# Patient Record
Sex: Female | Born: 1980
Health system: Southern US, Community
[De-identification: ages and names within clinical notes are randomized; demographics above are authoritative.]

## PROBLEM LIST (undated history)

## (undated) DIAGNOSIS — G43909 Migraine, unspecified, not intractable, without status migrainosus: Secondary | ICD-10-CM

## (undated) DIAGNOSIS — Z8619 Personal history of other infectious and parasitic diseases: Secondary | ICD-10-CM

## (undated) HISTORY — DX: Migraine, unspecified, not intractable, without status migrainosus: G43.909

## (undated) HISTORY — DX: Personal history of other infectious and parasitic diseases: Z86.19

---

## 2000-01-22 HISTORY — PX: CHOLECYSTECTOMY: SHX55

## 2000-01-22 HISTORY — PX: TUBAL LIGATION: SHX77

## 2014-07-22 DIAGNOSIS — G43909 Migraine, unspecified, not intractable, without status migrainosus: Secondary | ICD-10-CM

## 2014-07-22 HISTORY — DX: Migraine, unspecified, not intractable, without status migrainosus: G43.909

## 2014-08-03 ENCOUNTER — Encounter: Payer: Self-pay | Admitting: Family

## 2014-08-03 ENCOUNTER — Ambulatory Visit (INDEPENDENT_AMBULATORY_CARE_PROVIDER_SITE_OTHER): Payer: 59 | Admitting: Family

## 2014-08-03 VITALS — BP 122/86 | HR 79 | Temp 98.3°F | Resp 16 | Ht 65.0 in | Wt 213.0 lb

## 2014-08-03 DIAGNOSIS — G43809 Other migraine, not intractable, without status migrainosus: Secondary | ICD-10-CM

## 2014-08-03 DIAGNOSIS — G43909 Migraine, unspecified, not intractable, without status migrainosus: Secondary | ICD-10-CM | POA: Insufficient documentation

## 2014-08-03 MED ORDER — ONDANSETRON 4 MG PO TBDP: 4.0000 mg | ORAL_TABLET | Freq: Three times a day (TID) | ORAL | Status: DC | PRN

## 2014-08-03 MED ORDER — SUMATRIPTAN SUCCINATE 50 MG PO TABS: 50.0000 mg | ORAL_TABLET | ORAL | Status: DC | PRN

## 2014-08-03 NOTE — Patient Instructions (Signed)
Start Imitrex as needed for migraines, you can use zofran as needed for nausea. Schedule a complete physical at the front desk. Welcome to [facility]!

## 2014-08-03 NOTE — Assessment & Plan Note (Signed)
Uncontrolled. Will add trial of imitrex prn, zofran odt prn nausea. Discussed addition of prophylactic med, but at this point pt would like to try a prn med which I think is reasonable. If migraines become more severe/frequent can reconsider.

## 2014-08-03 NOTE — Progress Notes (Signed)
Pre visit review using our clinic review tool, if applicable. No additional management support is needed unless otherwise documented below in the visit note. 

## 2014-08-03 NOTE — Progress Notes (Signed)
Subjective:    Patient ID: Kathy [name], female    DOB: Jun 04, 1980, 34 y.o.   MRN: [id number]  HPI   Ms. Kathy [name] is a 34 yr old female who presents today to establish care.  She presents today with chief complaint about headache. She reports severe headaches x 5 years but in the last 7-8 months she noted that her headaches have become more severe.  Had HA last Wednesday which was associated with nausea/vomitting, photo/phonophobia. She uses ibuprofen with some improvement usually, but it did not help the most recent HA lasted 2-3 days. Denies family history of migraines.  In the last month she has had 3-4 headaches, but rarely as severe as HA last week.     Review of Systems  Constitutional: Negative for unexpected weight change.  HENT: Positive for rhinorrhea and sinus pressure. Negative for hearing loss.   Eyes: Negative for visual disturbance.  Respiratory: Negative for cough and shortness of breath.   Cardiovascular: Negative for chest pain and leg swelling.  Gastrointestinal: Negative for nausea, diarrhea and constipation.  Genitourinary: Negative for dysuria, frequency and menstrual problem.  Musculoskeletal: Negative for myalgias and arthralgias.  Skin: Negative for rash.  Neurological: Positive for headaches.  Hematological: Negative for adenopathy.  Psychiatric/Behavioral: Negative for dysphoric mood and agitation.   Past Medical History  Diagnosis Date  . History of chicken pox     History   Social History  . Marital Status: Married    Spouse Name: N/A  . Number of Children: N/A  . Years of Education: N/A   Occupational History  . Not on file.   Social History Main Topics  . Smoking status: Never Smoker   . Smokeless tobacco: Never Used  . Alcohol Use: No  . Drug Use: No  . Sexual Activity: Not on file   Other Topics Concern  . 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 Cholecystectomy  2002  . Tubal ligation  2002    Family History  Problem Relation Age of Onset  . Hypertension Father   . Arthritis Maternal Grandmother   . Diabetes Maternal Grandmother   . Congestive Heart Failure Maternal Grandmother     No Known Allergies  No current outpatient prescriptions on file prior to visit.   No current facility-administered medications on file prior to visit.    BP 122/86 mmHg  Pulse 79  Temp(Src) 98.3 F (36.8 C) (Oral)  Resp 16  Ht 5\' 5"  (1.651 m)  Wt 213 lb (96.616 kg)  BMI 35.44 kg/m2  SpO2 99%  LMP 07/17/2014       Objective:   Physical Exam  Constitutional: She appears well-developed and well-nourished.  HENT:  Head: Normocephalic and atraumatic.  Eyes: EOM are normal. Pupils are equal, round, and reactive to light.  Cardiovascular: Normal rate, regular rhythm and normal heart sounds.   No murmur heard. Pulmonary/Chest: Effort normal and breath sounds normal. No respiratory distress. She has no wheezes.  Lymphadenopathy:    She has no cervical adenopathy.  Neurological: She is alert. She exhibits normal muscle tone. Coordination normal.  Psychiatric: She has a normal mood and affect. Her behavior is normal.  Judgment and thought content normal.          Assessment & Plan:

## 2014-10-22 LAB — HM PAP SMEAR: HM Pap smear: NORMAL

## 2014-11-02 ENCOUNTER — Encounter: Payer: 59 | Admitting: Family

## 2015-02-07 ENCOUNTER — Encounter: Payer: Self-pay | Admitting: Family

## 2015-02-07 ENCOUNTER — Telehealth: Payer: Self-pay | Admitting: Family

## 2015-02-07 ENCOUNTER — Encounter: Payer: 59 | Admitting: Family

## 2015-02-10 ENCOUNTER — Encounter: Payer: Self-pay | Admitting: Family

## 2015-02-10 NOTE — Telephone Encounter (Signed)
Pt was no show 02/07/15 9:00am for cpe, pt rescheduled thru my chart 02/07/15 9:09am but no call to the office as to why she didn't come for appt, pt has cancelled cpe previously, charge or no charge?

## 2015-02-10 NOTE — Telephone Encounter (Signed)
Marked to charge & mailing letter °

## 2015-02-10 NOTE — Telephone Encounter (Signed)
Yes please

## 2015-02-16 ENCOUNTER — Telehealth: Payer: Self-pay | Admitting: Family

## 2015-02-16 NOTE — Telephone Encounter (Signed)
Pt called in stating that she received a no show letter.. Pt says that she called in an lvm before appt cancelling it. Pt would like to not be charged the no show fee.  Please assist   CB [phone]

## 2015-02-17 NOTE — Telephone Encounter (Signed)
Ok

## 2015-02-17 NOTE — Telephone Encounter (Signed)
Already sent to [place]

## 2015-02-22 DIAGNOSIS — N926 Irregular menstruation, unspecified: Secondary | ICD-10-CM | POA: Diagnosis not present

## 2015-03-16 ENCOUNTER — Encounter: Payer: Self-pay | Admitting: *Deleted

## 2015-03-16 ENCOUNTER — Telehealth: Payer: Self-pay | Admitting: *Deleted

## 2015-03-16 MED FILL — SUMATRIPTAN SUCC 50 MG TAB: 50 | 30 days supply | Qty: 9 | Fill #1

## 2015-03-16 NOTE — Telephone Encounter (Signed)
Pre-Visit Call completed with patient and chart updated.   Pre-Visit Info documented in Specialty Comments under [employer].    

## 2015-03-17 ENCOUNTER — Ambulatory Visit (INDEPENDENT_AMBULATORY_CARE_PROVIDER_SITE_OTHER): Payer: 59 | Admitting: Family

## 2015-03-17 VITALS — BP 124/68 | HR 71 | Temp 98.5°F | Ht 65.0 in | Wt 211.6 lb

## 2015-03-17 DIAGNOSIS — Z23 Encounter for immunization: Secondary | ICD-10-CM

## 2015-03-17 DIAGNOSIS — Z0000 Encounter for general adult medical examination without abnormal findings: Secondary | ICD-10-CM

## 2015-03-17 LAB — BASIC METABOLIC PANEL
BUN: 12 mg/dL (ref 6–23)
CHLORIDE: 107 meq/L (ref 96–112)
CO2: 28 mEq/L (ref 19–32)
Calcium: 9.4 mg/dL (ref 8.4–10.5)
Creatinine, Ser: 0.92 mg/dL (ref 0.40–1.20)
GFR: 73.68 mL/min (ref >60.00)
Glucose, Bld: 85 mg/dL (ref 70–99)
Potassium: 3.9 mEq/L (ref 3.5–5.1)
Sodium: 139 mEq/L (ref 135–145)

## 2015-03-17 LAB — CBC WITH DIFFERENTIAL/PLATELET
BASOS ABS: 0 10*3/uL (ref 0.0–0.1)
Basophils Relative: 0.3 % (ref 0.0–3.0)
Eosinophils Absolute: 0 10*3/uL (ref 0.0–0.7)
Eosinophils Relative: 0.5 % (ref 0.0–5.0)
HCT: 41.5 % (ref 36.0–46.0)
HEMOGLOBIN: 14.3 g/dL (ref 12.0–15.0)
LYMPHS ABS: 2.3 10*3/uL (ref 0.7–4.0)
Lymphocytes Relative: 41.4 % (ref 12.0–46.0)
MCHC: 34.4 g/dL (ref 30.0–36.0)
MCV: 86.7 fl (ref 78.0–100.0)
Monocytes Absolute: 0.4 10*3/uL (ref 0.1–1.0)
Monocytes Relative: 7.2 % (ref 3.0–12.0)
NEUTROS PCT: 50.6 % (ref 43.0–77.0)
Neutro Abs: 2.8 10*3/uL (ref 1.4–7.7)
Platelets: 314 10*3/uL (ref 150.0–400.0)
RBC: 4.79 Mil/uL (ref 3.87–5.11)
RDW: 12.6 % (ref 11.5–15.5)
WBC: 5.6 10*3/uL (ref 4.0–10.5)

## 2015-03-17 LAB — URINALYSIS, ROUTINE W REFLEX MICROSCOPIC
BILIRUBIN URINE: NEGATIVE
HGB URINE DIPSTICK: NEGATIVE
Ketones, ur: NEGATIVE
Leukocytes, UA: NEGATIVE
NITRITE: NEGATIVE
Specific Gravity, Urine: 1.015 (ref 1.000–1.030)
Total Protein, Urine: NEGATIVE
Urine Glucose: NEGATIVE
Urobilinogen, UA: 0.2 (ref 0.0–1.0)
pH: 7 (ref 5.0–8.0)

## 2015-03-17 LAB — HEPATIC FUNCTION PANEL
ALK PHOS: 34 U/L — AB (ref 39–117)
ALT: 17 U/L (ref 0–35)
AST: 18 U/L (ref 0–37)
Albumin: 4.4 g/dL (ref 3.5–5.2)
BILIRUBIN TOTAL: 0.6 mg/dL (ref 0.2–1.2)
Bilirubin, Direct: 0.1 mg/dL (ref 0.0–0.3)
Total Protein: 7.7 g/dL (ref 6.0–8.3)

## 2015-03-17 LAB — LIPID PANEL
CHOLESTEROL: 189 mg/dL (ref 0–200)
HDL: 34.3 mg/dL — ABNORMAL LOW (ref >39.00)
LDL CALC: 132 mg/dL — AB (ref 0–99)
NonHDL: 154.86
Total CHOL/HDL Ratio: 6
Triglycerides: 114 mg/dL (ref 0.0–149.0)
VLDL: 22.8 mg/dL (ref 0.0–40.0)

## 2015-03-17 LAB — TSH: TSH: 0.59 u[IU]/mL (ref 0.35–4.50)

## 2015-03-17 NOTE — Progress Notes (Signed)
Subjective:    Patient ID: Kathy [name], female    DOB: 29-Nov-1980, 35 y.o.   MRN: [id number]  HPI  Patient presents today for complete physical.  Immunizations: Due for Tdap Diet: Good diet, working on weight loss (weight was up to 227)  Wt Readings from Last 3 Encounters:  03/17/15 211 lb 9.6 oz (95.981 kg)  08/03/14 213 lb (96.616 kg)  Exercise: treadmill or [employer] most days.  Pap Smear: 10/17 Dental/Eye- March 2017  Rare migraines    Review of Systems  Constitutional: Negative for fever, fatigue and unexpected weight change.  HENT: Negative for hearing loss, postnasal drip, rhinorrhea, tinnitus and voice change.   Eyes: Negative for visual disturbance.  Respiratory: Negative for cough, shortness of breath and wheezing.   Cardiovascular: Negative for chest pain, palpitations and leg swelling.  Gastrointestinal: Negative for nausea, diarrhea and constipation.  Genitourinary: Negative for urgency and frequency.  Musculoskeletal:       Denies joint stiffness  Skin: Negative for rash.  Neurological: Negative for dizziness, numbness and headaches.  Hematological: Negative for adenopathy.  Psychiatric/Behavioral:       Denies mood changes       Past Medical History  Diagnosis Date  . History of chicken pox   . Migraine 07/2014    Social History   Social History  . Marital Status: Married    Spouse Name: N/A  . Number of Children: N/A  . Years of Education: N/A   Occupational History  . Not on file.   Social History Main Topics  . Smoking status: Never Smoker   . Smokeless tobacco: Never Used  . Alcohol Use: No  . Drug Use: No  . Sexual Activity: Not on file   Other Topics Concern  . 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 Cholecystectomy  2002  . Tubal ligation  2002    Family History  Problem Relation Age of Onset  . Hypertension Father   . Arthritis Maternal Grandmother   . Diabetes Maternal Grandmother   . Congestive Heart Failure Maternal Grandmother   . Diabetes Mellitus II Maternal Grandfather     No Known Allergies  Current Outpatient Prescriptions on File Prior to Visit  Medication Sig Dispense Refill  . ondansetron (ZOFRAN ODT) 4 MG disintegrating tablet Take 1 tablet (4 mg total) by mouth every 8 (eight) hours as needed for nausea or vomiting. 20 tablet 0  . SUMAtriptan (IMITREX) 50 MG tablet Take 1 tablet (50 mg total) by mouth every 2 (two) hours as needed for migraine. May repeat in 2 hours if headache persists or recurs. 10 tablet 5   No current facility-administered medications on file prior to visit.    BP 124/68 mmHg  Pulse 71  Temp(Src) 98.5 F (36.9 C) (Oral)  Ht  (1.651 m)  Wt 211 lb 9.6 oz (95.981 kg)  BMI 35.21 kg/m2  SpO2 100%    Objective:   Physical Exam  Physical Exam  Constitutional: She is oriented to person, place, and time. She appears well-developed and well-nourished. No distress.  HENT:  Head: Normocephalic and atraumatic.  Right Ear: Tympanic membrane and ear canal  normal.  Left Ear: Tympanic membrane and ear canal normal.  Mouth/Throat: Oropharynx is clear and moist.  Eyes: Pupils are equal, round, and reactive to light. No scleral icterus.  Neck: Normal range of motion. No thyromegaly present.  Cardiovascular: Normal rate and regular rhythm.   No murmur heard. Pulmonary/Chest: Effort normal and breath sounds normal. No respiratory distress. He has no wheezes. She has no rales. She exhibits no tenderness.  Abdominal: Soft. Bowel sounds are normal. He exhibits no distension and no mass. There is no tenderness. There is no rebound and no guarding.  Musculoskeletal: She exhibits no edema.  Lymphadenopathy:    She has no  cervical adenopathy.  Neurological: She is alert and oriented to person, place, and time. She has normal patellar reflexes. She exhibits normal muscle tone. Coordination normal.  Skin: Skin is warm and dry.  Psychiatric: She has a normal mood and affect. Her behavior is normal. Judgment and thought content normal.  Breast/pelvic:  Deferred to gyn.          Assessment & Plan:         Assessment & Plan:  Preventative Care- discussed healthy diet, exercise, weight loss. Tdap today, obtain routine labs.

## 2015-03-17 NOTE — Patient Instructions (Signed)
Continue healthy diet, exercise, weight loss efforts. Complete lab work prior to leaving. 

## 2015-03-17 NOTE — Progress Notes (Signed)
Pre visit review using our clinic review tool, if applicable. No additional management support is needed unless otherwise documented below in the visit note. 

## 2015-04-12 ENCOUNTER — Telehealth: Payer: 59 | Admitting: Family

## 2015-04-12 DIAGNOSIS — J111 Influenza due to unidentified influenza virus with other respiratory manifestations: Secondary | ICD-10-CM | POA: Diagnosis not present

## 2015-04-12 MED ORDER — OSELTAMIVIR PHOSPHATE 75 MG PO CAPS: 75.0000 mg | ORAL_CAPSULE | Freq: Two times a day (BID) | ORAL | Status: DC

## 2015-04-12 NOTE — Progress Notes (Signed)

## 2015-08-10 NOTE — Telephone Encounter (Signed)
Completed.

## 2015-08-23 DIAGNOSIS — H5213 Myopia, bilateral: Secondary | ICD-10-CM | POA: Diagnosis not present

## 2015-11-02 ENCOUNTER — Encounter: Payer: Self-pay | Admitting: Family

## 2015-11-02 MED ORDER — SCOPOLAMINE 1 MG/3DAYS TD PT72: 1.0000 | MEDICATED_PATCH | TRANSDERMAL | 0 refills | Status: DC

## 2015-11-13 ENCOUNTER — Encounter: Payer: Self-pay | Admitting: Family

## 2015-11-14 MED ORDER — SCOPOLAMINE 1 MG/3DAYS TD PT72: 1.0000 | MEDICATED_PATCH | TRANSDERMAL | 0 refills | Status: DC

## 2015-11-28 MED FILL — SCOPOLAMINE 1 MG/3 DAY PATC: 1 | 18 days supply | Qty: 6 | Fill #0

## 2016-04-22 ENCOUNTER — Ambulatory Visit: Payer: Self-pay | Admitting: Family

## 2016-05-10 ENCOUNTER — Encounter: Payer: Self-pay | Admitting: Urology

## 2016-05-10 ENCOUNTER — Ambulatory Visit (INDEPENDENT_AMBULATORY_CARE_PROVIDER_SITE_OTHER): Payer: 59 | Admitting: Urology

## 2016-05-10 VITALS — BP 114/77 | HR 78 | Ht 65.0 in | Wt 201.0 lb

## 2016-05-10 DIAGNOSIS — R350 Frequency of micturition: Secondary | ICD-10-CM | POA: Diagnosis not present

## 2016-05-10 DIAGNOSIS — R3915 Urgency of urination: Secondary | ICD-10-CM | POA: Diagnosis not present

## 2016-05-10 DIAGNOSIS — N393 Stress incontinence (female) (male): Secondary | ICD-10-CM

## 2016-05-10 LAB — BLADDER SCAN AMB NON-IMAGING

## 2016-05-10 LAB — URINALYSIS, COMPLETE
BILIRUBIN UA: NEGATIVE
Glucose, UA: NEGATIVE
Ketones, UA: NEGATIVE
Nitrite, UA: NEGATIVE
PH UA: 5 (ref 5.0–7.5)
PROTEIN UA: NEGATIVE
RBC UA: NEGATIVE
SPEC GRAV UA: 1.025 (ref 1.005–1.030)
Urobilinogen, Ur: 0.2 mg/dL (ref 0.2–1.0)

## 2016-05-10 LAB — MICROSCOPIC EXAMINATION

## 2016-05-10 NOTE — Progress Notes (Signed)
05/10/2016 10:22 AM   Kathy [name] May 22, 1980 [id number]  Referring provider: Sandford Craze, NP [address] [place], [place] [address]  Chief Complaint  Patient presents with  . Urinary Frequency    New Patient    HPI: 36 year old female who presents today for evaluation of stress and urge urinary incontinence.  That over the past year, she is developed worsening stress urinary incontinence and leaks somewhat when she exercises, laughs, coughs, or sneezes. In addition to this and more bothersome than this, when she is exercising, she will develop a severe urge to need to immediately excuse herself to void.  This is bothersome to her and has limited her exercise routine.  She is wearing liners, up to 3 pads per day.    She does get up 1-3 times to urinate.  Other than the aforementioned, she has no baseline urinary urgency or frequency. No recurrent urinary tract infections, dysuria, or gross hematuria.  She has children 2 vaginal deliveries.  No difficulty in tearing/lacerations during delivery.  She has lost 30 lbs volitionally over the past year with a strict diet and exercise routine. She is very motivated currently.  She does not drink soda, coffee or tea.   She takes primarily water but admits that she does not drink as much as she think she ought to.  PMH: Past Medical History:  Diagnosis Date  . History of chicken pox   . Migraine 07/2014    Surgical History: Past Surgical History:  Procedure Laterality Date  . CHOLECYSTECTOMY  2002  . TUBAL LIGATION  2002    Home Medications:  Allergies as of 05/10/2016   No Known Allergies     Medication List       Accurate as of 05/10/16 10:22 AM. Always use your most recent med list.          ondansetron 4 MG disintegrating tablet Commonly known as:  ZOFRAN ODT Take 1 tablet (4 mg total) by mouth every 8 (eight) hours as needed for nausea or vomiting.   SUMAtriptan 50 MG tablet Commonly  known as:  IMITREX Take 1 tablet (50 mg total) by mouth every 2 (two) hours as needed for migraine. May repeat in 2 hours if headache persists or recurs.       Allergies: No Known Allergies  Family History: Family History  Problem Relation Age of Onset  . Hypertension Father   . Arthritis Maternal Grandmother   . Diabetes Maternal Grandmother   . Congestive Heart Failure Maternal Grandmother   . Diabetes Mellitus II Maternal Grandfather     Social History:  reports that she has never smoked. She has never used smokeless tobacco. She reports that she does not drink alcohol or use drugs.  ROS: UROLOGY Frequent Urination?: Yes Hard to postpone urination?: Yes Burning/pain with urination?: No Get up at night to urinate?: Yes Leakage of urine?: Yes Urine stream starts and stops?: No Trouble starting stream?: No Do you have to strain to urinate?: No Blood in urine?: No Urinary tract infection?: No Sexually transmitted disease?: No Injury to kidneys or bladder?: No Painful intercourse?: No Weak stream?: No Currently pregnant?: No Vaginal bleeding?: No Last menstrual period?: 04/19/16  Gastrointestinal Nausea?: No Vomiting?: No Indigestion/heartburn?: No Diarrhea?: No Constipation?: No  Constitutional Fever: No Night sweats?: No Weight loss?: No Fatigue?: No  Skin Skin rash/lesions?: No Itching?: No  Eyes Blurred vision?: No Double vision?: No  Ears/Nose/Throat Sore throat?: No Sinus problems?:  No  Hematologic/Lymphatic Swollen glands?: No Easy bruising?: No  Cardiovascular Leg swelling?: No Chest pain?: No  Respiratory Cough?: No Shortness of breath?: No  Endocrine Excessive thirst?: No  Musculoskeletal Back pain?: No Joint pain?: No  Neurological Headaches?: Yes Dizziness?: No  Psychologic Depression?: No Anxiety?: No  Physical Exam: BP 114/77   Pulse 78   Ht  (1.651 m)   Wt 201 lb (91.2 kg)   LMP 04/19/2016   BMI 33.45  kg/m   Constitutional:  Alert and oriented, No acute distress. HEENT: [place] AT, moist mucus membranes.  Trachea midline, no masses. Cardiovascular: No clubbing, cyanosis, or edema. Respiratory: Normal respiratory effort, no increased work of breathing. GI: Abdomen is soft, nontender, nondistended, no abdominal masses GU: No CVA tenderness.  Skin: No rashes, bruises or suspicious lesions.. Neurologic: Grossly intact, no focal deficits, moving all 4 extremities. Psychiatric: Normal mood and affect.  Laboratory Data: Lab Results  Component Value Date   WBC 5.6 03/17/2015   HGB 14.3 03/17/2015   HCT 41.5 03/17/2015   MCV 86.7 03/17/2015   PLT 314.0 03/17/2015    Lab Results  Component Value Date   CREATININE 0.92 03/17/2015    Urinalysis Results for orders placed or performed in visit on 05/10/16  Microscopic Examination  Result Value Ref Range   WBC, UA 6-10 (A) 0 - 5 /hpf   Epithelial Cells (non renal) 0-10 0 - 10 /hpf   Bacteria, UA Moderate (A) None seen/Few  Urinalysis, Complete  Result Value Ref Range   Specific Gravity, UA 1.025 1.005 - 1.030   pH, UA 5.0 5.0 - 7.5   Color, UA Yellow Yellow   Appearance Ur Clear Clear   Leukocytes, UA Trace (A) Negative   Protein, UA Negative Negative/Trace   Glucose, UA Negative Negative   Ketones, UA Negative Negative   RBC, UA Negative Negative   Bilirubin, UA Negative Negative   Urobilinogen, Ur 0.2 0.2 - 1.0 mg/dL   Nitrite, UA Negative Negative   Microscopic Examination See below:   BLADDER SCAN AMB NON-IMAGING  Result Value Ref Range   Scan Result 51ml     Pertinent Imaging: Results for orders placed or performed in visit on 05/10/16  BLADDER SCAN AMB NON-IMAGING  Result Value Ref Range   Scan Result 51ml      Assessment & Plan:    1. Stress incontinence Recommend continued weight loss and pelvic floor therapy Given information about [facility]  Referral to PT Briefly discussed mid urethral sling which would  likely be effective, done with childbearing status post tubal ligation  I will have her follow up with my partner, Dr. Sherron Monday t to discuss this further - Urinalysis, Complete - BLADDER SCAN AMB NON-IMAGING - Ambulatory referral to Physical Therapy  2. Urgency of urination Other some stress induced urge May improve with treatment of her stress incontinence Behavioral modification discussed   Return in about 3 months (around 08/09/2016) for Dr. Lissa Hoard to disucss stress incontinence .   Vanna Scotland, MD  [facility] [address] [place], [place] [address] [phone]

## 2016-05-10 NOTE — Progress Notes (Deleted)
05/10/2016 10:12 AM   Kathy [name] [birth year] [id number]  Referring provider: Sandford Craze, NP [address] [place], [place] [address]  Chief Complaint  Patient presents with  . Urinary Frequency    New Patient    HPI: ***     PMH: Past Medical History:  Diagnosis Date  . History of chicken pox   . Migraine 07/2014    Surgical History: Past Surgical History:  Procedure Laterality Date  . CHOLECYSTECTOMY  2002  . TUBAL LIGATION  2002    Home Medications:  Allergies as of 05/10/2016   No Known Allergies     Medication List       Accurate as of 05/10/16 10:12 AM. Always use your most recent med list.          ondansetron 4 MG disintegrating tablet Commonly known as:  ZOFRAN ODT Take 1 tablet (4 mg total) by mouth every 8 (eight) hours as needed for nausea or vomiting.   SUMAtriptan 50 MG tablet Commonly known as:  IMITREX Take 1 tablet (50 mg total) by mouth every 2 (two) hours as needed for migraine. May repeat in 2 hours if headache persists or recurs.       Allergies: No Known Allergies  Family History: Family History  Problem Relation Age of Onset  . Hypertension Father   . Arthritis Maternal Grandmother   . Diabetes Maternal Grandmother   . Congestive Heart Failure Maternal Grandmother   . Diabetes Mellitus II Maternal Grandfather     Social History:  reports that she has never smoked. She has never used smokeless tobacco. She reports that she does not drink alcohol or use drugs.  ROS: UROLOGY Frequent Urination?: Yes Hard to postpone urination?: Yes Burning/pain with urination?: No Get up at night to urinate?: Yes Leakage of urine?: Yes Urine stream starts and stops?: No Trouble starting stream?: No Do you have to strain to urinate?: No Blood in urine?: No Urinary tract infection?: No Sexually transmitted disease?: No Injury to kidneys or bladder?: No Painful intercourse?: No Weak stream?: No Currently  pregnant?: No Vaginal bleeding?: No Last menstrual period?: 04/19/16  Gastrointestinal Nausea?: No Vomiting?: No Indigestion/heartburn?: No Diarrhea?: No Constipation?: No  Constitutional Fever: No Night sweats?: No Weight loss?: No Fatigue?: No  Skin Skin rash/lesions?: No Itching?: No  Eyes Blurred vision?: No Double vision?: No  Ears/Nose/Throat Sore throat?: No Sinus problems?: No  Hematologic/Lymphatic Swollen glands?: No Easy bruising?: No  Cardiovascular Leg swelling?: No Chest pain?: No  Respiratory Cough?: No Shortness of breath?: No  Endocrine Excessive thirst?: No  Musculoskeletal Back pain?: No Joint pain?: No  Neurological Headaches?: Yes Dizziness?: No  Psychologic Depression?: No Anxiety?: No  Physical Exam: BP 114/77   Pulse 78   Ht  (1.651 m)   Wt 201 lb (91.2 kg)   LMP 04/19/2016   BMI 33.45 kg/m   Constitutional:  Alert and oriented, No acute distress. HEENT: [place] AT, moist mucus membranes.  Trachea midline, no masses. Cardiovascular: No clubbing, cyanosis, or edema. Respiratory: Normal respiratory effort, no increased work of breathing. GI: Abdomen is soft, nontender, nondistended, no abdominal masses GU: No CVA tenderness. *** Skin: No rashes, bruises or suspicious lesions. Lymph: No cervical or inguinal adenopathy. Neurologic: Grossly intact, no focal deficits, moving all 4 extremities. Psychiatric: Normal mood and affect.  Laboratory Data: Lab Results  Component Value Date   WBC 5.6 03/17/2015   HGB 14.3 03/17/2015   HCT 41.5 03/17/2015  MCV 86.7 03/17/2015   PLT 314.0 03/17/2015    Lab Results  Component Value Date   CREATININE 0.92 03/17/2015    No results found for: PSA  No results found for: TESTOSTERONE  No results found for: HGBA1C  Urinalysis    Component Value Date/Time   COLORURINE YELLOW 03/17/2015 1109   APPEARANCEUR CLEAR 03/17/2015 1109   LABSPEC 1.015 03/17/2015 1109   PHURINE  7.0 03/17/2015 1109   GLUCOSEU NEGATIVE 03/17/2015 1109   HGBUR NEGATIVE 03/17/2015 1109   BILIRUBINUR NEGATIVE 03/17/2015 1109   KETONESUR NEGATIVE 03/17/2015 1109   UROBILINOGEN 0.2 03/17/2015 1109   NITRITE NEGATIVE 03/17/2015 1109   LEUKOCYTESUR NEGATIVE 03/17/2015 1109    Pertinent Imaging: ***  Assessment & Plan:  ***  1. Urinary frequency *** - Urinalysis, Complete - BLADDER SCAN AMB NON-IMAGING   No Follow-up on file.  Vanna Scotland, MD  [facility] [address], [address] [place], [place] [address] [phone]

## 2016-05-27 ENCOUNTER — Ambulatory Visit (INDEPENDENT_AMBULATORY_CARE_PROVIDER_SITE_OTHER): Payer: 59 | Admitting: Family

## 2016-05-27 ENCOUNTER — Encounter: Payer: Self-pay | Admitting: Family

## 2016-05-27 VITALS — BP 138/61 | HR 68 | Temp 98.8°F | Resp 16 | Ht 65.0 in | Wt 206.6 lb

## 2016-05-27 DIAGNOSIS — G43809 Other migraine, not intractable, without status migrainosus: Secondary | ICD-10-CM

## 2016-05-27 DIAGNOSIS — Z87898 Personal history of other specified conditions: Secondary | ICD-10-CM | POA: Diagnosis not present

## 2016-05-27 MED ORDER — SUMATRIPTAN SUCCINATE 50 MG PO TABS: 50.0000 mg | ORAL_TABLET | ORAL | 5 refills | Status: DC | PRN

## 2016-05-27 MED ORDER — SCOPOLAMINE 1 MG/3DAYS TD PT72: 1.0000 | MEDICATED_PATCH | TRANSDERMAL | 0 refills | Status: DC

## 2016-05-27 MED ORDER — ONDANSETRON 4 MG PO TBDP: 4.0000 mg | ORAL_TABLET | Freq: Three times a day (TID) | ORAL | 3 refills | Status: DC | PRN

## 2016-05-27 MED FILL — ONDANSETRON ODT 4 MG TABLET: 4 | 7 days supply | Qty: 20 | Fill #0

## 2016-05-27 MED FILL — SUMATRIPTAN SUCC 50 MG TAB: 50 | 60 days supply | Qty: 18 | Fill #0

## 2016-05-27 MED FILL — TRANSDERM-SCOP 1.5 MG/72HR: 1 | 9 days supply | Qty: 3 | Fill #0

## 2016-05-27 NOTE — Patient Instructions (Addendum)
Continue imitrex and zofran as needed.  Please schedule a complete physical some time this summer.

## 2016-05-27 NOTE — Assessment & Plan Note (Signed)
Stable. Continue prn imitrex.  

## 2016-05-27 NOTE — Progress Notes (Signed)
Pre visit review using our clinic review tool, if applicable. No additional management support is needed unless otherwise documented below in the visit note. 

## 2016-05-27 NOTE — Progress Notes (Signed)
   Subjective:    Patient ID: Kathy [name], female    DOB: [birth year], 36 y.o.   MRN: [id number]  HPI  Ms. Kathy [name] is a 36 yr old female who presents today for follow up of her migraines. Reports that she often will get migraines 1-2 days before her periods.  She is ok the rest of Sometimes has associated nausea. No migraines the rest of the month.   She reports that she has an upcoming cruise planned this summer for 8 days and is requesting an rx for scopolamine patch.   Review of Systems See HPI  See HPI  Past Medical History:  Diagnosis Date  . History of chicken pox   . Migraine 07/2014     Social History   Social History  . Marital status: Married    Spouse name: N/A  . Number of children: N/A  . Years of education: N/A   Occupational History  . Not on file.   Social History Main Topics  . Smoking status: Never Smoker  . Smokeless tobacco: Never Used  . Alcohol use No  . Drug use: No  . Sexual activity: Not on file   Other Topics Concern  . 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 CHOLECYSTECTOMY  2002  . TUBAL LIGATION  2002    Family History  Problem Relation Age of Onset  . Hypertension Father   . Arthritis Maternal Grandmother   . Diabetes Maternal Grandmother   . Congestive Heart Failure Maternal Grandmother   . Diabetes Mellitus II Maternal Grandfather     No Known Allergies  No current outpatient prescriptions on file prior to visit.   No current facility-administered medications on file prior to visit.     BP 138/61 (BP Location: Left Arm, Cuff Size: Large)   Pulse 68   Temp 98.8 F (37.1 C) (Oral)   Resp 16   Ht 5\' 5"  (1.651 m)   Wt 206 lb 9.6 oz (93.7 kg)   LMP 05/16/2016   SpO2 100%  Comment: room air  BMI 34.38 kg/m       Objective:   Physical Exam  Constitutional: She is oriented to person, place, and time. She appears well-developed and well-nourished.  HENT:  Head: Normocephalic and atraumatic.  Cardiovascular: Normal rate, regular rhythm and normal heart sounds.   No murmur heard. Pulmonary/Chest: Effort normal and breath sounds normal. No respiratory distress. She has no wheezes.  Musculoskeletal: She exhibits no edema.  Neurological: She is alert and oriented to person, place, and time.  Psychiatric: She has a normal mood and affect. Her behavior is normal. Judgment and thought content normal.          Assessment & Plan:  History of motion sickness- rx provided for scopolamine.

## 2016-06-13 ENCOUNTER — Telehealth: Payer: 59 | Admitting: Family

## 2016-06-13 DIAGNOSIS — J329 Chronic sinusitis, unspecified: Secondary | ICD-10-CM

## 2016-06-13 DIAGNOSIS — B9689 Other specified bacterial agents as the cause of diseases classified elsewhere: Secondary | ICD-10-CM

## 2016-06-13 DIAGNOSIS — B373 Candidiasis of vulva and vagina: Secondary | ICD-10-CM | POA: Diagnosis not present

## 2016-06-13 DIAGNOSIS — B3731 Acute candidiasis of vulva and vagina: Secondary | ICD-10-CM

## 2016-06-13 MED ORDER — FLUCONAZOLE 150 MG PO TABS: 150.0000 mg | ORAL_TABLET | Freq: Once | ORAL | 0 refills | Status: AC

## 2016-06-13 MED ORDER — AMOXICILLIN-POT CLAVULANATE 875-125 MG PO TABS: 1.0000 | ORAL_TABLET | Freq: Two times a day (BID) | ORAL | 0 refills | Status: AC

## 2016-06-13 NOTE — Progress Notes (Signed)
*  Thank you for all the details in the phone call and comment boxes. See plan below.   We are sorry that you are not feeling well.  Here is how we plan to help!  Based on what you have shared with me it looks like you have sinusitis.  Sinusitis is inflammation and infection in the sinus cavities of the head.  Based on your presentation I believe you most likely have Acute Bacterial Sinusitis.  This is an infection caused by bacteria and is treated with antibiotics. I have prescribed Augmentin 875mg /125mg  one tablet twice daily with food, for 7 days. You may use an oral decongestant such as Mucinex D or if you have glaucoma or high blood pressure use plain Mucinex. Saline nasal spray help and can safely be used as often as needed for congestion.  If you develop worsening sinus pain, fever or notice severe headache and vision changes, or if symptoms are not better after completion of antibiotic, please schedule an appointment with a health care provider.    I also included Diflucan 150mg  x1 in case you need it.   Sinus infections are not as easily transmitted as other respiratory infection, however we still recommend that you avoid close contact with loved ones, especially the very young and elderly.  Remember to wash your hands thoroughly throughout the day as this is the number one way to prevent the spread of infection!  Home Care:  Only take medications as instructed by your medical team.  Complete the entire course of an antibiotic.  Do not take these medications with alcohol.  A steam or ultrasonic humidifier can help congestion.  You can place a towel over your head and breathe in the steam from hot water coming from a faucet.  Avoid close contacts especially the very young and the elderly.  Cover your mouth when you cough or sneeze.  Always remember to wash your hands.  Get Help Right Away If:  You develop worsening fever or sinus pain.  You develop a severe head ache or visual  changes.  Your symptoms persist after you have completed your treatment plan.  Make sure you  Understand these instructions.  Will watch your condition.  Will get help right away if you are not doing well or get worse.  Your e-visit answers were reviewed by a board certified advanced clinical practitioner to complete your personal care plan.  Depending on the condition, your plan could have included both over the counter or prescription medications.  If there is a problem please reply  once you have received a response from your provider.  Your safety is important to us.  If you have drug allergies check your prescription carefully.    You can use MyChart to ask questions about today's visit, request a non-urgent call back, or ask for a work or school excuse for 24 hours related to this e-Visit. If it has been greater than 24 hours you will need to follow up with your provider, or enter a new e-Visit to address those concerns.  You will get an e-mail in the next two days asking about your experience.  I hope that your e-visit has been valuable and will speed your recovery. Thank you for using e-visits.

## 2016-08-19 ENCOUNTER — Ambulatory Visit: Payer: 59

## 2016-09-16 DIAGNOSIS — M542 Cervicalgia: Secondary | ICD-10-CM | POA: Diagnosis not present

## 2017-02-04 ENCOUNTER — Encounter: Payer: Self-pay | Admitting: Family

## 2017-02-04 ENCOUNTER — Ambulatory Visit (INDEPENDENT_AMBULATORY_CARE_PROVIDER_SITE_OTHER): Payer: 59 | Admitting: Family

## 2017-02-04 VITALS — BP 135/80 | HR 74 | Temp 97.8°F | Resp 16 | Ht 65.0 in | Wt 210.0 lb

## 2017-02-04 DIAGNOSIS — I10 Essential (primary) hypertension: Secondary | ICD-10-CM | POA: Insufficient documentation

## 2017-02-04 DIAGNOSIS — Z0000 Encounter for general adult medical examination without abnormal findings: Secondary | ICD-10-CM | POA: Diagnosis not present

## 2017-02-04 DIAGNOSIS — R030 Elevated blood-pressure reading, without diagnosis of hypertension: Secondary | ICD-10-CM | POA: Diagnosis not present

## 2017-02-04 DIAGNOSIS — J069 Acute upper respiratory infection, unspecified: Secondary | ICD-10-CM | POA: Diagnosis not present

## 2017-02-04 LAB — CBC WITH DIFFERENTIAL/PLATELET
BASOS PCT: 0.2 % (ref 0.0–3.0)
Basophils Absolute: 0 10*3/uL (ref 0.0–0.1)
EOS PCT: 0.5 % (ref 0.0–5.0)
Eosinophils Absolute: 0 10*3/uL (ref 0.0–0.7)
HEMATOCRIT: 42.4 % (ref 36.0–46.0)
HEMOGLOBIN: 14.5 g/dL (ref 12.0–15.0)
LYMPHS PCT: 30.2 % (ref 12.0–46.0)
Lymphs Abs: 2.3 10*3/uL (ref 0.7–4.0)
MCHC: 34.3 g/dL (ref 30.0–36.0)
MCV: 87.6 fl (ref 78.0–100.0)
MONO ABS: 0.6 10*3/uL (ref 0.1–1.0)
MONOS PCT: 7.3 % (ref 3.0–12.0)
Neutro Abs: 4.7 10*3/uL (ref 1.4–7.7)
Neutrophils Relative %: 61.8 % (ref 43.0–77.0)
Platelets: 311 10*3/uL (ref 150.0–400.0)
RBC: 4.84 Mil/uL (ref 3.87–5.11)
RDW: 13 % (ref 11.5–15.5)
WBC: 7.6 10*3/uL (ref 4.0–10.5)

## 2017-02-04 LAB — URINALYSIS, ROUTINE W REFLEX MICROSCOPIC
Bilirubin Urine: NEGATIVE
Ketones, ur: 15 — AB
Leukocytes, UA: NEGATIVE
NITRITE: NEGATIVE
RBC / HPF: NONE SEEN (ref 0–?)
Specific Gravity, Urine: >=1.03 — AB (ref 1.000–1.030)
Total Protein, Urine: NEGATIVE
URINE GLUCOSE: NEGATIVE
UROBILINOGEN UA: 0.2 (ref 0.0–1.0)
pH: 5.5 (ref 5.0–8.0)

## 2017-02-04 LAB — LIPID PANEL
CHOLESTEROL: 176 mg/dL (ref 0–200)
HDL: 39 mg/dL — ABNORMAL LOW (ref >39.00)
LDL Cholesterol: 116 mg/dL — ABNORMAL HIGH (ref 0–99)
NonHDL: 136.96
TRIGLYCERIDES: 105 mg/dL (ref 0.0–149.0)
Total CHOL/HDL Ratio: 5
VLDL: 21 mg/dL (ref 0.0–40.0)

## 2017-02-04 LAB — HEPATIC FUNCTION PANEL
ALBUMIN: 4.5 g/dL (ref 3.5–5.2)
ALT: 15 U/L (ref 0–35)
AST: 16 U/L (ref 0–37)
Alkaline Phosphatase: 38 U/L — ABNORMAL LOW (ref 39–117)
Bilirubin, Direct: 0.1 mg/dL (ref 0.0–0.3)
Total Bilirubin: 0.5 mg/dL (ref 0.2–1.2)
Total Protein: 8.1 g/dL (ref 6.0–8.3)

## 2017-02-04 LAB — BASIC METABOLIC PANEL
BUN: 18 mg/dL (ref 6–23)
CHLORIDE: 103 meq/L (ref 96–112)
CO2: 28 mEq/L (ref 19–32)
Calcium: 9.4 mg/dL (ref 8.4–10.5)
Creatinine, Ser: 0.86 mg/dL (ref 0.40–1.20)
GFR: 78.8 mL/min (ref >60.00)
Glucose, Bld: 101 mg/dL — ABNORMAL HIGH (ref 70–99)
POTASSIUM: 4 meq/L (ref 3.5–5.1)
Sodium: 137 mEq/L (ref 135–145)

## 2017-02-04 LAB — TSH: TSH: 2 u[IU]/mL (ref 0.35–4.50)

## 2017-02-04 NOTE — Patient Instructions (Signed)
Please complete lab work prior to leaving. Work on healthy diet, exercise, weight loss.   

## 2017-02-04 NOTE — Progress Notes (Signed)
Subjective:    Patient ID: Kathy [name], female    DOB: 1981/01/01, 37 y.o.   MRN: [id number]  HPI  Patient is a 37 yr old female who presents today for cpx.  Patient presents today for complete physical.  Immunizations: tdap 2017,  Flu shot in October Diet: diet has not been very good Exercise: some exercise Pap Smear: 10/22/14,  Declines pap today  Vision: due Dental: up to date  Wt Readings from Last 3 Encounters:  02/04/17 210 lb (95.3 kg)  05/27/16 206 lb 9.6 oz (93.7 kg)  05/10/16 201 lb (91.2 kg)   Reports + sneezing, rhinorrhea.  Nasal congestion is worse at night. Had a HA last night, not a migraine. Did not work yesterday. Feels symptoms are better.  Symptoms began 11/13.   BP Readings from Last 3 Encounters:  02/04/17 135/80  05/27/16 138/61  05/10/16 114/77    Review of Systems  Constitutional: Negative for unexpected weight change.  HENT: Positive for rhinorrhea. Negative for hearing loss and sinus pressure.   Eyes: Negative for visual disturbance.  Respiratory: Negative for cough.   Cardiovascular: Negative for leg swelling.  Gastrointestinal: Negative for constipation and diarrhea.  Genitourinary: Negative for dysuria, frequency and menstrual problem.  Musculoskeletal: Negative for arthralgias and myalgias.  Skin: Negative for rash.  Neurological:       Reports no recent migraines  Hematological: Negative for adenopathy.  Psychiatric/Behavioral:       Denies depression/anxiety.    Past Medical History:  Diagnosis Date  . History of chicken pox   . Migraine 07/2014     Social History   Socioeconomic History  . Marital status: Married    Spouse name: Not on file  . Number of children: Not on file  . Years of education: Not on file  . Highest education level: Not on file  Social Needs  . Financial resource strain: Not on file  . Food insecurity - worry: Not on file  . Food insecurity - inability: Not on file  . Transportation needs -  medical: Not on file  . Transportation needs - non-medical: Not on file  Occupational History  . Not on file  Tobacco Use  . Smoking status: Never Smoker  . Smokeless tobacco: Never Used  Substance and Sexual Activity  . Alcohol use: No  . Drug use: No  . Sexual activity: Not on file  Other Topics Concern  . 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 CHOLECYSTECTOMY  2002  . TUBAL LIGATION  2002    Family History  Problem Relation Age of Onset  . Hypertension Father   . Arthritis Maternal Grandmother   . Diabetes Maternal Grandmother   . Congestive Heart Failure Maternal Grandmother   . Diabetes Mellitus II Maternal Grandfather   . Diabetes Mellitus II Paternal Grandmother   . COPD Paternal Grandfather     No Known Allergies  Current Outpatient Medications on File Prior to Visit  Medication Sig Dispense Refill  . ondansetron (ZOFRAN ODT) 4 MG disintegrating tablet Take 1 tablet (4 mg total) by mouth every 8 (eight) hours as needed for nausea or vomiting. 20 tablet 3  .  scopolamine (TRANSDERM-SCOP) 1 MG/3DAYS Place 1 patch (1.5 mg total) onto the skin every 3 (three) days. 3 patch 0  . SUMAtriptan (IMITREX) 50 MG tablet Take 1 tablet (50 mg total) by mouth every 2 (two) hours as needed for migraine. May repeat in 2 hours if headache persists or recurs. 10 tablet 5   No current facility-administered medications on file prior to visit.     BP 135/80 (BP Location: Left Arm, Patient Position: Sitting, Cuff Size: Large)   Pulse 74   Temp 97.8 F (36.6 C) (Oral)   Resp 16   Ht 5\' 5"  (1.651 m)   Wt 210 lb (95.3 kg)   LMP 01/20/2017 (Approximate)   SpO2 100%   BMI 34.95 kg/m        Objective:   Physical Exam  Physical Exam    Constitutional: She is oriented to person, place, and time. She appears well-developed and well-nourished. No distress.  HENT:  Head: Normocephalic and atraumatic.  Right Ear: Tympanic membrane and ear canal normal.  Left Ear: Tympanic membrane and ear canal normal.  Mouth/Throat: Oropharynx is clear and moist.  Eyes: Pupils are equal, round, and reactive to light. No scleral icterus.  Neck: Normal range of motion. No thyromegaly present.  Cardiovascular: Normal rate and regular rhythm.   No murmur heard. Pulmonary/Chest: Effort normal and breath sounds normal. No respiratory distress. He has no wheezes. She has no rales. She exhibits no tenderness.  Abdominal: Soft. Bowel sounds are normal. She exhibits no distension and no mass. There is no tenderness. There is no rebound and no guarding.  Musculoskeletal: She exhibits no edema.  Lymphadenopathy:    She has no cervical adenopathy.  Neurological: She is alert and oriented to person, place, and time. She has normal patellar reflexes. She exhibits normal muscle tone. Coordination normal.  Skin: Skin is warm and dry.  Psychiatric: She has a normal mood and affect. Her behavior is normal. Judgment and thought content normal.  Breasts: Examined lying Right: Without masses, retractions, discharge or axillary adenopathy.  Left: Without masses, retractions, discharge or axillary adenopathy.  Pelvic: deferred          Assessment & Plan:         Assessment & Plan:  Preventative care- discussed healthy diet, exercise, weight loss.  Immunizations reviewed and up to date. Will need Pap, declines today but will schedule. Obtain routine lab work.   Stage 1 HTN-  BP Readings from Last 3 Encounters:  02/04/17 135/80  05/27/16 138/61  05/10/16 114/77   Discussed importance of low sodium diet, exercise, weight loss.   URI- discussed supportive measures and to call if symptoms worsen or if symtpoms do not continue to improve.

## 2017-04-10 MED FILL — OXYCOD/ACETAMINOPHEN 5-325M: 5-325 | 7 days supply | Qty: 40 | Fill #0

## 2017-04-10 MED FILL — PROMETHAZINE 25 MG TABLET: 25 | 3 days supply | Qty: 10 | Fill #0

## 2017-04-10 MED FILL — diazePAM 5 MG TABS: 5 | 1 days supply | Qty: 1 | Fill #0

## 2017-04-10 MED FILL — CEFADROXIL 500 MG CAPSULE: 500 | 7 days supply | Qty: 14 | Fill #0

## 2017-05-29 ENCOUNTER — Encounter: Payer: Self-pay | Admitting: Family

## 2017-05-29 MED ORDER — ONDANSETRON 4 MG PO TBDP: 4.0000 mg | ORAL_TABLET | Freq: Three times a day (TID) | ORAL | 3 refills | Status: DC | PRN

## 2017-05-29 MED ORDER — SUMATRIPTAN SUCCINATE 50 MG PO TABS: 50.0000 mg | ORAL_TABLET | ORAL | 5 refills | Status: DC | PRN

## 2017-06-09 MED FILL — SUMAtriptan SUCCINATE 50 MG: 50 | 30 days supply | Qty: 10 | Fill #0

## 2017-06-09 MED FILL — ONDANSETRON ODT 4 MG TABLET: 4 | 7 days supply | Qty: 20 | Fill #0

## 2017-08-11 ENCOUNTER — Other Ambulatory Visit: Payer: Self-pay | Admitting: Family

## 2017-08-11 MED ORDER — SCOPOLAMINE 1 MG/3DAYS TD PT72: 1.0000 | MEDICATED_PATCH | TRANSDERMAL | 0 refills | Status: DC

## 2017-08-11 NOTE — Telephone Encounter (Signed)
Sent in rx of scopalamine. Epic brought up warning on not taking med if pregnant. So would you ask her to make sure not pregnant.

## 2017-08-11 NOTE — Telephone Encounter (Signed)
Kathy [name] -- please advise in PCP's absence? 

## 2017-08-12 NOTE — Telephone Encounter (Signed)
My chart message sent to pt.

## 2017-09-10 DIAGNOSIS — H5213 Myopia, bilateral: Secondary | ICD-10-CM | POA: Diagnosis not present

## 2017-10-21 ENCOUNTER — Ambulatory Visit: Payer: 59 | Admitting: Family

## 2017-10-22 ENCOUNTER — Ambulatory Visit (INDEPENDENT_AMBULATORY_CARE_PROVIDER_SITE_OTHER): Payer: 59 | Admitting: Family

## 2017-10-22 ENCOUNTER — Encounter: Payer: Self-pay | Admitting: Family

## 2017-10-22 ENCOUNTER — Other Ambulatory Visit (HOSPITAL_COMMUNITY)
Admission: RE | Admit: 2017-10-22 | Discharge: 2017-10-22 | Disposition: A | Discharge status: Home / Self Care | Payer: 59 | Source: Ambulatory Visit | Attending: Family | Admitting: Family

## 2017-10-22 VITALS — BP 116/54 | HR 69 | Temp 98.5°F | Resp 16 | Ht 65.0 in | Wt 191.0 lb

## 2017-10-22 DIAGNOSIS — G43909 Migraine, unspecified, not intractable, without status migrainosus: Secondary | ICD-10-CM | POA: Diagnosis not present

## 2017-10-22 DIAGNOSIS — Z01419 Encounter for gynecological examination (general) (routine) without abnormal findings: Secondary | ICD-10-CM | POA: Diagnosis not present

## 2017-10-22 NOTE — Progress Notes (Signed)
Subjective:    Patient ID: Kathy [name], female    DOB: [birth year], 37 y.o.   MRN: [id number]  HPI  Patient's last menstrual period was: 09/29/17 Sexually active:  yes The current method of family planning is: tubal ligation Exercising:  Smoker: non-smoker Pap: 2016- reportedly normal per patient History of abnormal Pap:  MMG: N/a  BMD:  N/a TDaP:  2017 Screening Labs:   Migraines- reports migraine a few weeks ago but overall has been well controlled.   Reports periods are regular.    Review of Systems    see HPI  Past Medical History:  Diagnosis Date  . History of chicken pox   . Migraine 07/2014     Social History   Socioeconomic History  . Marital status: Married    Spouse name: Not on file  . Number of children: Not on file  . Years of education: Not on file  . Highest education level: Not on file  Occupational History  . Not on file  Social Needs  . Financial resource strain: Not on file  . Food insecurity:    Worry: Not on file    Inability: Not on file  . Transportation needs:    Medical: Not on file    Non-medical: Not on file  Tobacco Use  . Smoking status: Never Smoker  . Smokeless tobacco: Never Used  Substance and Sexual Activity  . Alcohol use: No  . Drug use: No  . Sexual activity: Not on file  Lifestyle  . Physical activity:    Days per week: Not on file    Minutes per session: Not on file  . Stress: Not on file  Relationships  . Social connections:    Talks on phone: Not on file    Gets together: Not on file    Attends religious service: Not on file    Active member of club or organization: Not on file    Attends meetings of clubs or organizations: Not on file    Relationship status: Not on file  . Intimate partner violence:    Fear of current or ex partner: Not on file    Emotionally abused: Not on file    Physically abused: Not on file    Forced sexual activity: Not on file  Other Topics Concern  . 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 CHOLECYSTECTOMY  2002  . TUBAL LIGATION  2002    Family History  Problem Relation Age of Onset  . Hypertension Father   . Arthritis Maternal Grandmother   . Diabetes Maternal Grandmother   . Congestive Heart Failure Maternal Grandmother   . Diabetes Mellitus II Maternal Grandfather   . Diabetes Mellitus II Paternal Grandmother   . COPD Paternal Grandfather     No Known Allergies  Current Outpatient Medications on File Prior to Visit  Medication Sig Dispense Refill  . ondansetron (ZOFRAN ODT) 4 MG disintegrating tablet Take 1 tablet (4 mg total) by mouth every 8 (eight) hours as needed for nausea or vomiting. 20 tablet 3  . scopolamine (TRANSDERM-SCOP) 1 MG/3DAYS Place 1 patch (1.5 mg total) onto the skin every 3 (three)  days. 3 patch 0  . SUMAtriptan (IMITREX) 50 MG tablet Take 1 tablet (50 mg total) by mouth every 2 (two) hours as needed for migraine. May repeat in 2 hours if headache persists or recurs. 10 tablet 5   No current facility-administered medications on file prior to visit.     BP (!) 116/54 (BP Location: Right Arm, Patient Position: Sitting, Cuff Size: Large)   Pulse 69   Temp 98.5 F (36.9 C) (Oral)   Resp 16   Ht 5\' 5"  (1.651 m)   Wt 191 lb (86.6 kg)   LMP 09/29/2017   SpO2 100%   BMI 31.78 kg/m    Objective:   Physical Exam  Constitutional: She appears well-developed and well-nourished.  Cardiovascular: Normal rate, regular rhythm and normal heart sounds.  No murmur heard. Pulmonary/Chest: Effort normal and breath sounds normal. No respiratory distress. She has no wheezes.  Neurological: She is alert.  Skin: Skin is warm and dry.  Psychiatric: She has a normal mood and affect. Her  behavior is normal. Judgment and thought content normal.  Breasts: Examined lying and sitting.  Right: Without masses, retractions, discharge or axillary adenopathy.  Left: Without masses, retractions, discharge or axillary adenopathy.  Inguinal/mons: Normal without inguinal adenopathy  External genitalia: Normal  BUS/Urethra/Skene's glands: Normal  Bladder: Normal  Vagina: Normal  Cervix: Normal  Uterus: normal in size, shape and contour. Midline and mobile  Adnexa/parametria:  Rt: Without masses or tenderness.  Lt: Without masses or tenderness.  Anus and perineum: Normal          Assessment & Plan:  Migraines- stable with prn use of imitrex, continue same  GYN exam- normal. Pap sent with HPV. Discussed BSE. Flu shot up to date. Had lab work with cpx.  Results reviewed.

## 2017-10-23 LAB — CYTOLOGY - PAP
Diagnosis: NEGATIVE
HPV: NOT DETECTED

## 2017-11-14 ENCOUNTER — Encounter: Payer: Self-pay | Admitting: Family

## 2018-07-07 ENCOUNTER — Encounter: Payer: Self-pay | Admitting: Family

## 2018-07-07 ENCOUNTER — Other Ambulatory Visit: Payer: Self-pay | Admitting: Medical

## 2018-07-07 DIAGNOSIS — Z0000 Encounter for general adult medical examination without abnormal findings: Secondary | ICD-10-CM

## 2018-07-07 MED ORDER — SUMATRIPTAN SUCCINATE 50 MG PO TABS: 50.0000 mg | ORAL_TABLET | ORAL | 5 refills | Status: DC | PRN

## 2018-07-07 MED ORDER — ONDANSETRON 4 MG PO TBDP: 4.0000 mg | ORAL_TABLET | Freq: Three times a day (TID) | ORAL | 3 refills | Status: DC | PRN

## 2018-07-08 MED ORDER — SCOPOLAMINE 1 MG/3DAYS TD PT72: 1.0000 | MEDICATED_PATCH | TRANSDERMAL | 0 refills | Status: DC

## 2018-07-31 ENCOUNTER — Other Ambulatory Visit: Payer: Self-pay

## 2018-07-31 ENCOUNTER — Telehealth: Payer: Self-pay | Admitting: Family

## 2018-07-31 ENCOUNTER — Encounter (HOSPITAL_BASED_OUTPATIENT_CLINIC_OR_DEPARTMENT_OTHER): Payer: Self-pay

## 2018-07-31 ENCOUNTER — Ambulatory Visit (HOSPITAL_BASED_OUTPATIENT_CLINIC_OR_DEPARTMENT_OTHER)
Admission: RE | Admit: 2018-07-31 | Discharge: 2018-07-31 | Disposition: A | Discharge status: Home / Self Care | Payer: 59 | Source: Ambulatory Visit | Attending: Family | Admitting: Family

## 2018-07-31 DIAGNOSIS — Z1231 Encounter for screening mammogram for malignant neoplasm of breast: Secondary | ICD-10-CM | POA: Diagnosis not present

## 2018-07-31 DIAGNOSIS — R928 Other abnormal and inconclusive findings on diagnostic imaging of breast: Secondary | ICD-10-CM | POA: Insufficient documentation

## 2018-07-31 DIAGNOSIS — Z0000 Encounter for general adult medical examination without abnormal findings: Secondary | ICD-10-CM

## 2018-07-31 MED FILL — SUMATRIPTAN SUCC 50 MG TAB: 50 | 15 days supply | Qty: 9 | Fill #0

## 2018-07-31 MED FILL — ONDANSETRON ODT 4 MG TABLET: 4 | 7 days supply | Qty: 20 | Fill #0

## 2018-07-31 NOTE — Telephone Encounter (Signed)
Please let pt know that the radiologist would like her to complete some additional breast images for further evaluation. Let me know if she has not been contacted by them about a follow up appointment in 1 week.   

## 2018-07-31 NOTE — Telephone Encounter (Signed)
Left message to return call 

## 2018-08-03 ENCOUNTER — Other Ambulatory Visit: Payer: Self-pay | Admitting: Family

## 2018-08-03 DIAGNOSIS — R928 Other abnormal and inconclusive findings on diagnostic imaging of breast: Secondary | ICD-10-CM

## 2018-08-03 NOTE — Telephone Encounter (Signed)
Spoke w/ Pt- she will let us know if she has not heard back by Thursday or Friday to schedule additional imaging.

## 2018-08-11 ENCOUNTER — Other Ambulatory Visit: Payer: Self-pay | Admitting: Family

## 2018-08-11 ENCOUNTER — Ambulatory Visit
Admission: RE | Admit: 2018-08-11 | Discharge: 2018-08-11 | Disposition: A | Discharge status: Home / Self Care | Payer: 59 | Source: Ambulatory Visit | Attending: Family | Admitting: Family

## 2018-08-11 ENCOUNTER — Other Ambulatory Visit: Payer: Self-pay

## 2018-08-11 DIAGNOSIS — R928 Other abnormal and inconclusive findings on diagnostic imaging of breast: Secondary | ICD-10-CM

## 2018-08-11 DIAGNOSIS — N6012 Diffuse cystic mastopathy of left breast: Secondary | ICD-10-CM | POA: Diagnosis not present

## 2018-08-11 DIAGNOSIS — N6489 Other specified disorders of breast: Secondary | ICD-10-CM

## 2018-08-11 DIAGNOSIS — N6011 Diffuse cystic mastopathy of right breast: Secondary | ICD-10-CM | POA: Diagnosis not present

## 2018-09-11 DIAGNOSIS — H4423 Degenerative myopia, bilateral: Secondary | ICD-10-CM | POA: Diagnosis not present

## 2018-09-11 DIAGNOSIS — H5213 Myopia, bilateral: Secondary | ICD-10-CM | POA: Diagnosis not present

## 2018-11-04 ENCOUNTER — Ambulatory Visit: Payer: 59 | Admitting: Family

## 2018-11-06 ENCOUNTER — Encounter: Payer: 59 | Admitting: Family

## 2019-01-17 DIAGNOSIS — R519 Headache, unspecified: Secondary | ICD-10-CM | POA: Diagnosis not present

## 2019-01-17 DIAGNOSIS — R438 Other disturbances of smell and taste: Secondary | ICD-10-CM | POA: Diagnosis not present

## 2019-01-17 DIAGNOSIS — Z20828 Contact with and (suspected) exposure to other viral communicable diseases: Secondary | ICD-10-CM | POA: Diagnosis not present

## 2019-01-17 DIAGNOSIS — R430 Anosmia: Secondary | ICD-10-CM | POA: Diagnosis not present

## 2019-01-18 ENCOUNTER — Other Ambulatory Visit: Payer: 59

## 2019-02-15 ENCOUNTER — Other Ambulatory Visit: Payer: Self-pay

## 2019-02-15 ENCOUNTER — Other Ambulatory Visit: Payer: Self-pay | Admitting: Family

## 2019-02-15 ENCOUNTER — Ambulatory Visit
Admission: RE | Admit: 2019-02-15 | Discharge: 2019-02-15 | Disposition: A | Discharge status: Home / Self Care | Payer: 59 | Source: Ambulatory Visit | Attending: Family | Admitting: Family

## 2019-02-15 DIAGNOSIS — N6489 Other specified disorders of breast: Secondary | ICD-10-CM

## 2019-02-15 DIAGNOSIS — R928 Other abnormal and inconclusive findings on diagnostic imaging of breast: Secondary | ICD-10-CM | POA: Diagnosis not present

## 2019-02-25 DIAGNOSIS — H04123 Dry eye syndrome of bilateral lacrimal glands: Secondary | ICD-10-CM | POA: Diagnosis not present

## 2019-04-26 ENCOUNTER — Other Ambulatory Visit: Payer: Self-pay | Admitting: Family

## 2019-05-26 ENCOUNTER — Other Ambulatory Visit: Payer: Self-pay

## 2019-05-26 ENCOUNTER — Encounter: Payer: Self-pay | Admitting: Family

## 2019-05-26 ENCOUNTER — Ambulatory Visit (INDEPENDENT_AMBULATORY_CARE_PROVIDER_SITE_OTHER): Payer: 59 | Admitting: Family

## 2019-05-26 VITALS — BP 135/87 | HR 73 | Temp 98.7°F | Resp 16 | Ht 65.3 in | Wt 192.0 lb

## 2019-05-26 DIAGNOSIS — Z0000 Encounter for general adult medical examination without abnormal findings: Secondary | ICD-10-CM

## 2019-05-26 DIAGNOSIS — E669 Obesity, unspecified: Secondary | ICD-10-CM

## 2019-05-26 LAB — CBC WITH DIFFERENTIAL/PLATELET
Basophils Absolute: 0 10*3/uL (ref 0.0–0.1)
Basophils Relative: 0.3 % (ref 0.0–3.0)
Eosinophils Absolute: 0 10*3/uL (ref 0.0–0.7)
Eosinophils Relative: 0.3 % (ref 0.0–5.0)
HCT: 39.8 % (ref 36.0–46.0)
Hemoglobin: 13.8 g/dL (ref 12.0–15.0)
Lymphocytes Relative: 36.4 % (ref 12.0–46.0)
Lymphs Abs: 2.9 10*3/uL (ref 0.7–4.0)
MCHC: 34.7 g/dL (ref 30.0–36.0)
MCV: 88.8 fl (ref 78.0–100.0)
Monocytes Absolute: 0.5 10*3/uL (ref 0.1–1.0)
Monocytes Relative: 6.8 % (ref 3.0–12.0)
Neutro Abs: 4.4 10*3/uL (ref 1.4–7.7)
Neutrophils Relative %: 56.2 % (ref 43.0–77.0)
Platelets: 272 10*3/uL (ref 150.0–400.0)
RBC: 4.48 Mil/uL (ref 3.87–5.11)
RDW: 13.1 % (ref 11.5–15.5)
WBC: 7.9 10*3/uL (ref 4.0–10.5)

## 2019-05-26 LAB — LIPID PANEL
Cholesterol: 185 mg/dL (ref 0–200)
HDL: 49.3 mg/dL (ref >39.00)
LDL Cholesterol: 114 mg/dL — ABNORMAL HIGH (ref 0–99)
NonHDL: 135.74
Total CHOL/HDL Ratio: 4
Triglycerides: 109 mg/dL (ref 0.0–149.0)
VLDL: 21.8 mg/dL (ref 0.0–40.0)

## 2019-05-26 LAB — BASIC METABOLIC PANEL
BUN: 21 mg/dL (ref 6–23)
CO2: 27 mEq/L (ref 19–32)
Calcium: 9.3 mg/dL (ref 8.4–10.5)
Chloride: 103 mEq/L (ref 96–112)
Creatinine, Ser: 0.81 mg/dL (ref 0.40–1.20)
GFR: 78.49 mL/min (ref >60.00)
Glucose, Bld: 75 mg/dL (ref 70–99)
Potassium: 4 mEq/L (ref 3.5–5.1)
Sodium: 136 mEq/L (ref 135–145)

## 2019-05-26 LAB — HEPATIC FUNCTION PANEL
ALT: 21 U/L (ref 0–35)
AST: 18 U/L (ref 0–37)
Albumin: 4.2 g/dL (ref 3.5–5.2)
Alkaline Phosphatase: 32 U/L — ABNORMAL LOW (ref 39–117)
Bilirubin, Direct: 0.1 mg/dL (ref 0.0–0.3)
Total Bilirubin: 0.6 mg/dL (ref 0.2–1.2)
Total Protein: 7.1 g/dL (ref 6.0–8.3)

## 2019-05-26 LAB — TSH: TSH: 1.4 u[IU]/mL (ref 0.35–4.50)

## 2019-05-26 MED ORDER — SUMATRIPTAN SUCCINATE 50 MG PO TABS: 50.0000 mg | ORAL_TABLET | ORAL | 5 refills | Status: DC | PRN

## 2019-05-26 MED ORDER — ONDANSETRON 4 MG PO TBDP: 4.0000 mg | ORAL_TABLET | Freq: Three times a day (TID) | ORAL | 3 refills | Status: DC | PRN

## 2019-05-26 MED FILL — SUMATRIPTAN SUCC 50 MG TAB: 50 | 15 days supply | Qty: 9 | Fill #0

## 2019-05-26 MED FILL — ONDANSETRON ODT 4MG TBDP: 4 | 7 days supply | Qty: 20 | Fill #0

## 2019-05-26 NOTE — Progress Notes (Signed)
Subjective:    Patient ID: Kathy [name], female    DOB: 1980/10/30, 39 y.o.   MRN: [id number]  HPI   Patient presents today for complete physical.  Immunizations: tetanus 2017  Diet: not healthy, stress eating Wt Readings from Last 3 Encounters:  05/26/19 192 lb (87.1 kg)  10/22/17 191 lb (86.6 kg)  02/04/17 210 lb (95.3 kg)  Exercise:  Cardio 30 minutes a day Pap Smear: 10/19 Mammogram: 1/20 Dental: up to date Vision: last august- scheduled  Review of Systems  Constitutional: Negative for unexpected weight change.  HENT: Negative for hearing loss and rhinorrhea.   Eyes: Negative for visual disturbance.  Respiratory: Negative for cough and shortness of breath.   Cardiovascular: Negative for chest pain.  Gastrointestinal: Negative for blood in stool, constipation and diarrhea.  Genitourinary: Negative for dysuria, frequency and hematuria.  Musculoskeletal: Negative for arthralgias and myalgias.  Skin: Negative for rash.  Neurological: Positive for headaches (rarely).  Hematological: Negative for adenopathy.  Psychiatric/Behavioral:       Denies depression/anxiety       Past Medical History:  Diagnosis Date  . History of chicken pox   . Migraine 07/2014     Social History   Socioeconomic History  . Marital status: Married    Spouse name: Not on file  . Number of children: Not on file  . Years of education: Not on file  . Highest education level: Not on file  Occupational History  . Not on file  Tobacco Use  . Smoking status: Never Smoker  . Smokeless tobacco: Never Used  Substance and Sexual Activity  . Alcohol use: No  . Drug use: No  . Sexual activity: Not on file  Other Topics Concern  . Not on file  Social History Narrative   Married   2 children    1998- son Thurmond Butts   2002- Donna Christen   Works at the [employer] at the [employer] degree   Enjoys sleeping, going to pool   Social Determinants of Systems developer Strain:   . Difficulty of Paying Living Expenses:   Food Insecurity:   . Worried About Charity fundraiser in the Last Year:   . Arboriculturist in the Last Year:   Transportation Needs:   . Film/video editor (Medical):   Marland Kitchen Lack of Transportation (Non-Medical):   Physical Activity:   . Days of Exercise per Week:   . Minutes of Exercise per Session:   Stress:   . Feeling of Stress :   Social Connections:   . Frequency of Communication with Friends and Family:   . Frequency of Social Gatherings with Friends and Family:   . Attends Religious Services:   . Active Member of Clubs or Organizations:   . Attends Archivist Meetings:   Marland Kitchen Marital Status:   Intimate Partner Violence:   . Fear of Current or Ex-Partner:   . Emotionally Abused:   Marland Kitchen Physically Abused:   . Sexually Abused:     Past Surgical History:  Procedure Laterality Date  . CHOLECYSTECTOMY  2002  . TUBAL LIGATION  2002    Family History  Problem Relation Age of Onset  . Breast cancer Mother   . Hypertension Father   . Arthritis Maternal Grandmother   . Diabetes Maternal Grandmother   . Congestive Heart Failure Maternal Grandmother   . Diabetes Mellitus II Maternal Grandfather   . Diabetes Mellitus  II Paternal Grandmother   . COPD Paternal Grandfather     No Known Allergies  No current outpatient medications on file prior to visit.   No current facility-administered medications on file prior to visit.    BP 135/87 (BP Location: Right Arm, Patient Position: Sitting, Cuff Size: Large)   Pulse 73   Temp 98.7 F (37.1 C) (Temporal)   Resp 16   Ht 5' 5.3" (1.659 m)   Wt 192 lb (87.1 kg)   SpO2 100%   BMI 31.66 kg/m    Objective:   Physical Exam  Physical Exam  Constitutional: She is oriented to person, place, and time. She appears well-developed and well-nourished. No distress.  HENT:  Head: Normocephalic and atraumatic.  Right Ear: Tympanic membrane and ear canal normal.   Left Ear: Tympanic membrane and ear canal normal.  Mouth/Throat: not examined, pt wearing mask Eyes: Pupils are equal, round, and reactive to light. No scleral icterus.  Neck: Normal range of motion. No thyromegaly present.  Cardiovascular: Normal rate and regular rhythm.   No murmur heard. Pulmonary/Chest: Effort normal and breath sounds normal. No respiratory distress. He has no wheezes. She has no rales. She exhibits no tenderness.  Abdominal: Soft. Bowel sounds are normal. She exhibits no distension and no mass. There is no tenderness. There is no rebound and no guarding.  Musculoskeletal: She exhibits no edema.  Lymphadenopathy:    She has no cervical adenopathy.  Neurological: She is alert and oriented to person, place, and time. She has normal patellar reflexes. She exhibits normal muscle tone. Coordination normal.  Skin: Skin is warm and dry.  Psychiatric: She was Her behavior is normal. She was briefly tearful. Judgment and thought content normal.  Breast/pelvic deferred Assessment & Plan:   Preventative care- discussed diet/exercise weight loss.  Obtain routine lab work. Pap up to date.  Tetanus up to date. Counseled pt on covid vaccination. She is stressed that her younger son is graduating from [employer] and older son will be purchasing his first home. Support provided. She would like a referral to healthy weight and wellness. Referral placed.   This visit occurred during the SARS-CoV-2 public health emergency.  Safety protocols were in place, including screening questions prior to the visit, additional usage of staff PPE, and extensive cleaning of exam room while observing appropriate contact time as indicated for disinfecting solutions.        Assessment & Plan:

## 2019-05-26 NOTE — Patient Instructions (Signed)
Please complete lab work prior to leaving. You should be contacted about scheduling your appointment with the healthy weight and wellness.

## 2019-05-31 ENCOUNTER — Encounter: Payer: 59 | Admitting: Family

## 2019-08-20 ENCOUNTER — Telehealth: Payer: Self-pay | Admitting: Allergy & Immunology

## 2019-08-20 DIAGNOSIS — Z20822 Contact with and (suspected) exposure to covid-19: Secondary | ICD-10-CM | POA: Diagnosis not present

## 2019-08-20 NOTE — Telephone Encounter (Signed)
Patient is sick and was exposed to a known COVID19 contact. Screening testing ordered.   Kathy Bonds, MD [facility]

## 2019-08-21 LAB — SARS-COV-2, NAA 2 DAY TAT

## 2019-08-21 LAB — NOVEL CORONAVIRUS, NAA: SARS-CoV-2, NAA: DETECTED — AB

## 2019-08-22 ENCOUNTER — Telehealth: Payer: Self-pay | Admitting: Adult Health

## 2019-08-22 NOTE — Telephone Encounter (Signed)
Called and LMOM regarding monoclonal antibody treatment for COVID 19 given to those who are at risk for complications and/or hospitalization of the virus.  Patient meets criteria based on: BMI greater than 25  Call back number given: [phone]  My chart message: sent  Divine Hansley, NP  

## 2019-09-17 DIAGNOSIS — H5213 Myopia, bilateral: Secondary | ICD-10-CM | POA: Diagnosis not present

## 2019-09-17 DIAGNOSIS — H4423 Degenerative myopia, bilateral: Secondary | ICD-10-CM | POA: Diagnosis not present

## 2019-09-30 ENCOUNTER — Encounter: Payer: Self-pay | Admitting: Family

## 2019-10-06 ENCOUNTER — Other Ambulatory Visit: Payer: Self-pay

## 2019-10-06 ENCOUNTER — Ambulatory Visit: Payer: 59 | Admitting: Family

## 2019-10-06 VITALS — BP 118/80 | HR 87 | Resp 16 | Ht 65.0 in | Wt 191.0 lb

## 2019-10-06 DIAGNOSIS — Z6831 Body mass index (BMI) 31.0-31.9, adult: Secondary | ICD-10-CM | POA: Diagnosis not present

## 2019-10-06 DIAGNOSIS — F329 Major depressive disorder, single episode, unspecified: Secondary | ICD-10-CM | POA: Diagnosis not present

## 2019-10-06 DIAGNOSIS — E669 Obesity, unspecified: Secondary | ICD-10-CM

## 2019-10-06 DIAGNOSIS — F32A Depression, unspecified: Secondary | ICD-10-CM

## 2019-10-06 MED ORDER — BUPROPION HCL ER (XL) 150 MG PO TB24: 150.0000 mg | ORAL_TABLET | Freq: Every day | ORAL | 1 refills | Status: DC

## 2019-10-06 MED FILL — buPROPion HCL ER (XL) 150 M: 150 | 30 days supply | Qty: 30 | Fill #0

## 2019-10-06 MED FILL — SUMATRIPTAN SUCC 50 MG TAB: 50 | 15 days supply | Qty: 9 | Fill #1

## 2019-10-06 NOTE — Progress Notes (Signed)
Subjective:    Patient ID: Kathy [name], female    DOB: 28-Jul-1980, 39 y.o.   MRN: [id number]  HPI  Patient is a 39 yr old female who presents today to discuss depression. Dog passed away in 03-May-2022.  Her youngest son recently moved to [place] to attend college. She thought that her older son who is 43 recently bought a home. She thought that he would be moving back in with her and she is disappointed in this. Notes increased stress at work due to [employer].    Reports that she is frustrated with her weight.   Wt Readings from Last 3 Encounters:  10/06/19 191 lb (86.6 kg)  05/26/19 192 lb (87.1 kg)  10/22/17 191 lb (86.6 kg)  Lost taste and smell in early August.  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  Eats out a lot. zaxbys and dairy o's.  Review of Systems Past Medical History:  Diagnosis Date  . History of chicken pox   . Migraine 07/2014     Social History   Socioeconomic History  . Marital status: Married    Spouse name: Not on file  . Number of children: Not on file  . Years of education: Not on file  . Highest education level: Not on file  Occupational History  . Not on file  Tobacco Use  . Smoking status: Never Smoker  . Smokeless tobacco: Never Used  Substance and Sexual Activity  . Alcohol use: No  . Drug use: No  . Sexual activity: Not on file  Other Topics Concern  . 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 Difficulty of Paying Living Expenses: Not on file  Food Insecurity:   . Worried About Programme researcher, broadcasting/film/video in the Last Year: Not on file  . Ran Out of Food in the Last Year: Not on file    Transportation Needs:   . Lack of Transportation (Medical): Not on file  . Lack of Transportation (Non-Medical): Not on file  Physical Activity:   . Days of Exercise per Week: Not on file  . Minutes of Exercise per Session: Not on file  Stress:   . Feeling of Stress : Not on file  Social Connections:   . Frequency of Communication with Friends and Family: Not on file  . Frequency of Social Gatherings with Friends and Family: Not on file  . Attends Religious Services: Not on file  . Active Member of Clubs or Organizations: Not on file  . Attends Banker Meetings: Not on file  . Marital Status: Not on file  Intimate Partner Violence:   . Fear of Current or Ex-Partner: Not on file  . Emotionally Abused: Not on file  . Physically Abused: Not on file  . Sexually Abused: Not on file    Past Surgical History:  Procedure Laterality Date  . CHOLECYSTECTOMY  2002  . TUBAL LIGATION  2002    Family History  Problem Relation Age of Onset  . Breast cancer Mother   . Hypertension Father   . Arthritis Maternal  Grandmother   . Diabetes Maternal Grandmother   . Congestive Heart Failure Maternal Grandmother   . Diabetes Mellitus II Maternal Grandfather   . Diabetes Mellitus II Paternal Grandmother   . COPD Paternal Grandfather     No Known Allergies  Current Outpatient Medications on File Prior to Visit  Medication Sig Dispense Refill  . SUMAtriptan (IMITREX) 50 MG tablet Take 1 tablet (50 mg total) by mouth every 2 (two) hours as needed for migraine. May repeat in 2 hours if headache persists or recurs. 10 tablet 5   No current facility-administered medications on file prior to visit.    BP 118/80 (BP Location: Left Arm, Patient Position: Sitting, Cuff Size: Normal)   Pulse 87   Resp 16   Ht 5\' 5"  (1.651 m)   Wt 191 lb (86.6 kg)   SpO2 97%   BMI 31.78 kg/m       Objective:   Physical Exam Constitutional:      Appearance: Normal appearance. She is not  ill-appearing.  Neurological:     Mental Status: She is alert.  Psychiatric:        Attention and Perception: Attention normal.        Mood and Affect: Affect is tearful.        Speech: Speech normal.        Behavior: Behavior normal.        Cognition and Memory: Cognition normal.           Assessment & Plan:  Depression- uncontrolled.  Will give trial of wellbutrin.  I also suggested that she consider counseling. She says that there is a at her church that she has considered establishing with.  Obesity- discussed dietary changes including adding a healthy breakfast and preparing home cooked meals to bring to work for lunch, and avoiding fast food. Hopefully addition or wellbutrin will also help her with weight loss.  This visit occurred during the SARS-CoV-2 public health emergency.  Safety protocols were in place, including screening questions prior to the visit, additional usage of staff PPE, and extensive cleaning of exam room while observing appropriate contact time as indicated for disinfecting solutions.

## 2019-10-06 NOTE — Patient Instructions (Signed)
Please consider beginning some counseling. Start Wellbutrin once daily in the morning.  Follow up as scheduled.

## 2019-12-01 ENCOUNTER — Ambulatory Visit: Payer: 59 | Admitting: Family

## 2020-04-10 ENCOUNTER — Telehealth: Payer: Self-pay | Admitting: Family

## 2020-04-10 NOTE — Telephone Encounter (Signed)
See mychart.  

## 2020-08-22 IMAGING — MG DIGITAL DIAGNOSTIC BILAT W/ TOMO W/ CAD
8 series · 8 of 24 positions shown · non-contrast
Comparison: Previous exam(s).

CLINICAL DATA: Short-term interval follow-up of probable benign
asymmetries in both breast.

EXAM:
DIGITAL DIAGNOSTIC BILATERAL MAMMOGRAM WITH CAD AND TOMO

[L CC synth-2D]
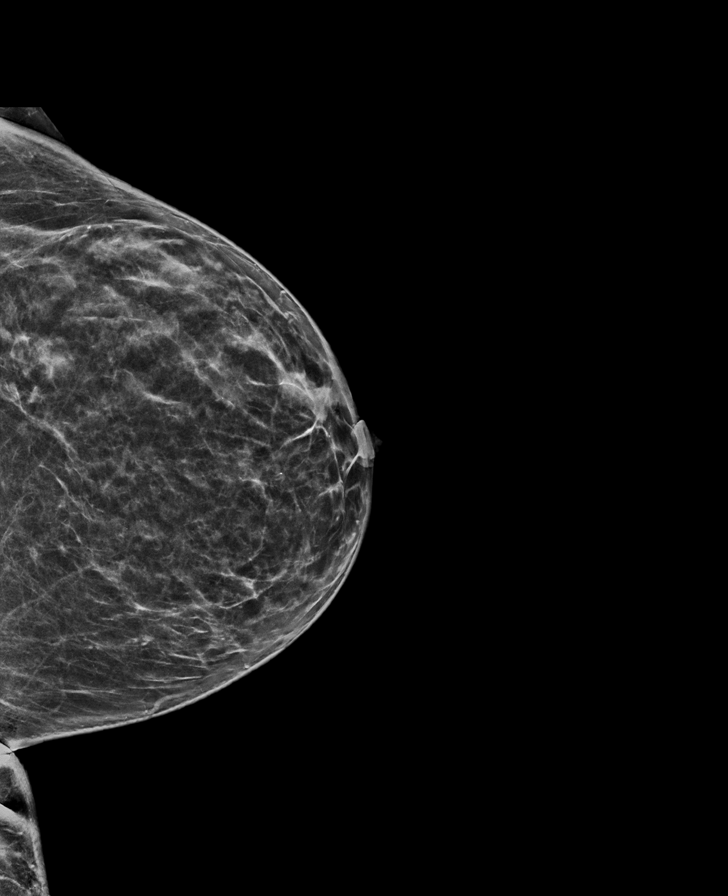

[R MLO synth-2D]
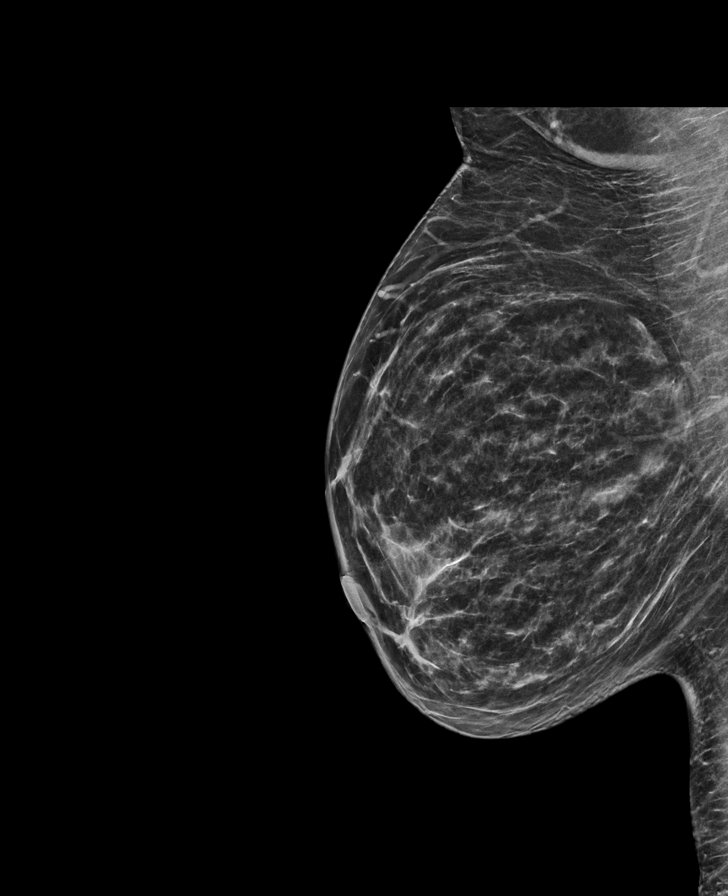

[R CC synth-2D]
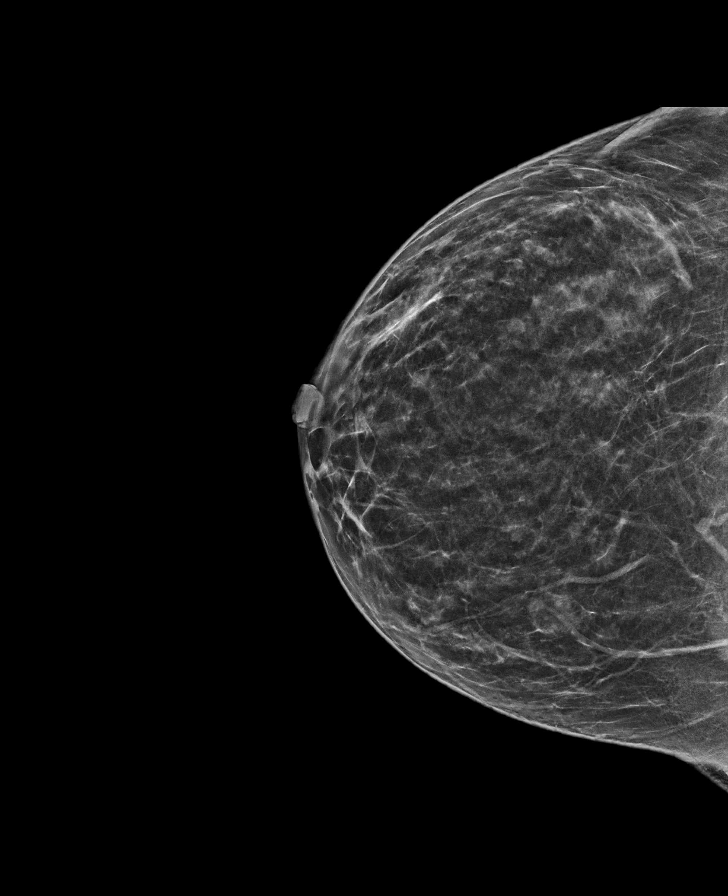

[L MLO synth-2D]
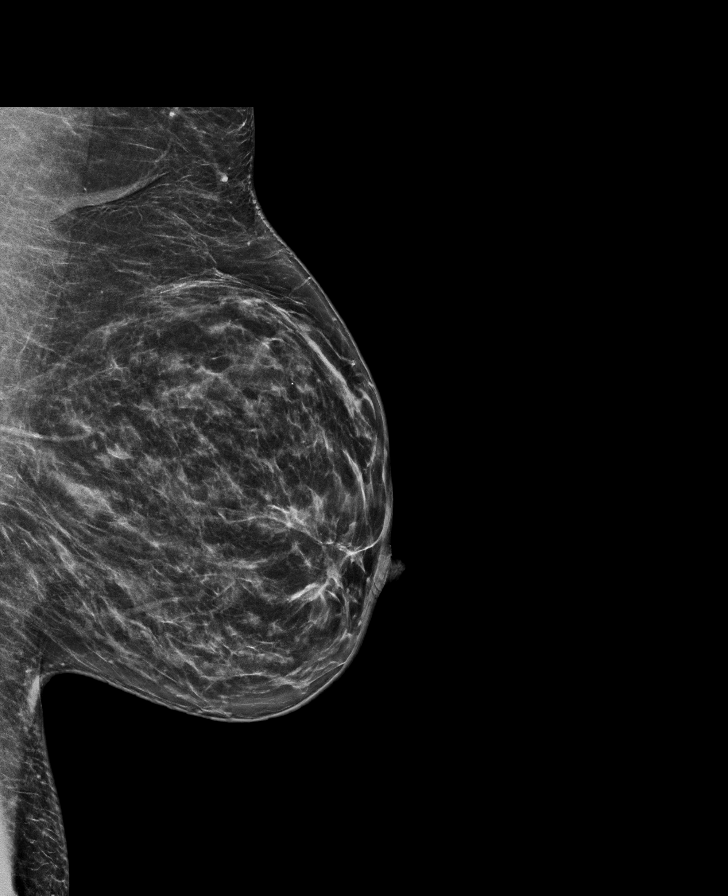

[L MLO tomo · tomo slice 32/63.0]
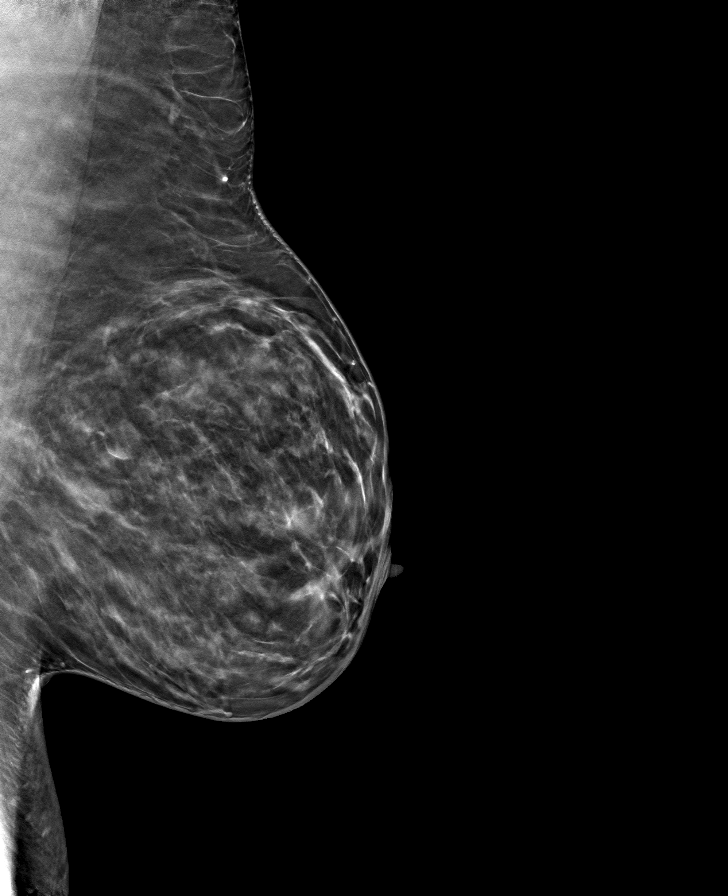

[L CC tomo · tomo slice 32/63.0]
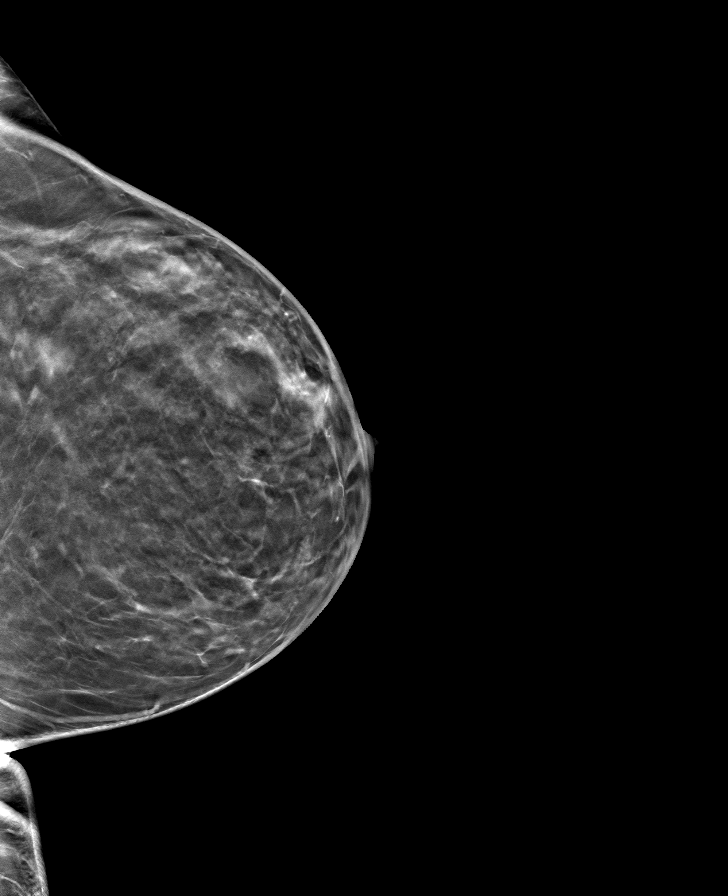

[R MLO tomo · tomo slice 31/62.0]
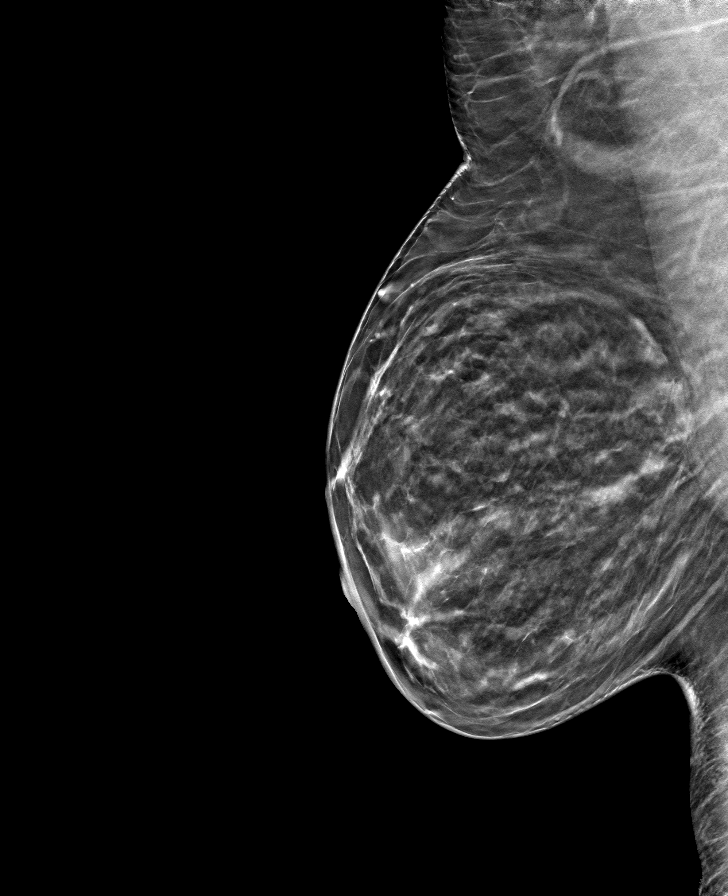

[R CC tomo · tomo slice 31/61.0]
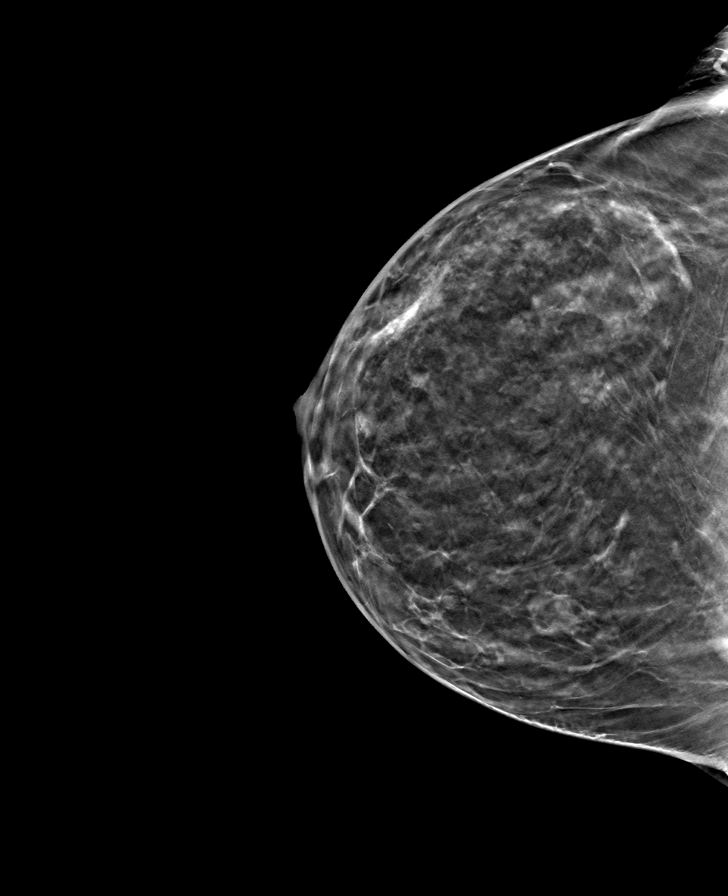

[8 of 24 positions shown; findings below may reference images not displayed]

ACR Breast Density Category b: There are scattered areas of
fibroglandular density.
FINDINGS: Asymmetries in the medial aspect of the right breast and the lateral
aspect of the left breast are stable compared to the prior exam
dated 07/31/2018. No suspicious mass or malignant type
microcalcifications identified.

Mammographic images were processed with CAD.
IMPRESSION: Stable probable benign asymmetries in both breast.

RECOMMENDATION:
Short-term interval follow-up bilateral mammogram in 6 months is
recommended.

I have discussed the findings and recommendations with the patient.
If applicable, a reminder letter will be sent to the patient
regarding the next appointment.

BI-RADS CATEGORY  3: Probably benign.

## 2020-10-10 DIAGNOSIS — H5213 Myopia, bilateral: Secondary | ICD-10-CM | POA: Diagnosis not present

## 2020-10-10 DIAGNOSIS — H52223 Regular astigmatism, bilateral: Secondary | ICD-10-CM | POA: Diagnosis not present

## 2020-11-16 DIAGNOSIS — Z23 Encounter for immunization: Secondary | ICD-10-CM | POA: Diagnosis not present

## 2020-12-28 DIAGNOSIS — L239 Allergic contact dermatitis, unspecified cause: Secondary | ICD-10-CM | POA: Diagnosis not present

## 2021-01-02 ENCOUNTER — Telehealth: Payer: 59 | Admitting: Nurse Practitioner

## 2021-01-02 DIAGNOSIS — J069 Acute upper respiratory infection, unspecified: Secondary | ICD-10-CM | POA: Diagnosis not present

## 2021-01-02 NOTE — Progress Notes (Signed)
E visit for Flu like symptoms   We are sorry that you are not feeling well.  Here is how we plan to help! Based on what you have shared with me it looks like you may have a respiratory virus that may be influenza.  Influenza or the flu is   an infection caused by a respiratory virus. The flu virus is highly contagious and persons who did not receive their yearly flu vaccination may catch the flu from close contact.  Symptoms of COVID are similar to those of flu, we would recommend taking a home COVID test if you can to assure that your symptoms are not from COVID.   If you test positive for COVID you can let us know, if you test negative for COVID you can also follow up to discuss anti-viral medications for flu. We would prefer you rule out COVID prior to taking any prescription medication so we can assure we are treating the correct virus.   Based upon your symptoms and potential risk factors I recommend that you follow the flu symptoms recommendation that I have listed below.  ANYONE WHO HAS FLU SYMPTOMS SHOULD: Stay home. The flu is highly contagious and going out or to work exposes others! Be sure to drink plenty of fluids. Water is fine as well as fruit juices, sodas and electrolyte beverages. You may want to stay away from caffeine or alcohol. If you are nauseated, try taking small sips of liquids. How do you know if you are getting enough fluid? Your urine should be a pale yellow or almost colorless. Get rest. Taking a steamy shower or using a humidifier may help nasal congestion and ease sore throat pain. Using a saline nasal spray works much the same way. Cough drops, hard candies and sore throat lozenges may ease your cough. Line up a caregiver. Have someone check on you regularly.   GET HELP RIGHT AWAY IF: You cannot keep down liquids or your medications. You become short of breath Your fell like you are going to pass out or loose consciousness. Your symptoms persist after  you have completed your treatment plan MAKE SURE YOU  Understand these instructions. Will watch your condition. Will get help right away if you are not doing well or get worse.  Your e-visit answers were reviewed by a board certified advanced clinical practitioner to complete your personal care plan.  Depending on the condition, your plan could have included both over the counter or prescription medications.  If there is a problem please reply  once you have received a response from your provider.  Your safety is important to [place].  If you have drug allergies check your prescription carefully.    You can use MyChart to ask questions about todays visit, request a non-urgent call back, or ask for a work or school excuse for 24 hours related to this e-Visit. If it has been greater than 24 hours you will need to follow up with your provider, or enter a new e-Visit to address those concerns.  You will get an e-mail in the next two days asking about your experience.  I hope that your e-visit has been valuable and will speed your recovery. Thank you for using e-visits.   I spent approximately 7 minutes reviewing the patient's history, current symptoms and coordinating their plan of care today.

## 2021-05-09 ENCOUNTER — Encounter: Payer: 59 | Admitting: Family

## 2021-07-27 ENCOUNTER — Other Ambulatory Visit: Payer: Self-pay | Admitting: Family

## 2021-07-27 DIAGNOSIS — Z1231 Encounter for screening mammogram for malignant neoplasm of breast: Secondary | ICD-10-CM

## 2021-07-30 ENCOUNTER — Other Ambulatory Visit: Payer: Self-pay | Admitting: Family

## 2021-07-30 ENCOUNTER — Inpatient Hospital Stay: Admission: RE | Admit: 2021-07-30 | Payer: 59 | Source: Ambulatory Visit

## 2021-07-30 DIAGNOSIS — Z09 Encounter for follow-up examination after completed treatment for conditions other than malignant neoplasm: Secondary | ICD-10-CM

## 2021-08-01 ENCOUNTER — Other Ambulatory Visit (HOSPITAL_COMMUNITY)
Admission: RE | Admit: 2021-08-01 | Discharge: 2021-08-01 | Disposition: A | Discharge status: Home / Self Care | Payer: 59 | Source: Ambulatory Visit | Attending: Family | Admitting: Family

## 2021-08-01 ENCOUNTER — Telehealth: Payer: Self-pay | Admitting: Family

## 2021-08-01 ENCOUNTER — Encounter: Payer: Self-pay | Admitting: Family

## 2021-08-01 ENCOUNTER — Other Ambulatory Visit (HOSPITAL_BASED_OUTPATIENT_CLINIC_OR_DEPARTMENT_OTHER): Payer: Self-pay

## 2021-08-01 ENCOUNTER — Ambulatory Visit (INDEPENDENT_AMBULATORY_CARE_PROVIDER_SITE_OTHER): Payer: 59 | Admitting: Family

## 2021-08-01 VITALS — BP 140/90 | HR 72 | Temp 98.4°F | Resp 16 | Ht 65.0 in | Wt 200.0 lb

## 2021-08-01 DIAGNOSIS — G43809 Other migraine, not intractable, without status migrainosus: Secondary | ICD-10-CM | POA: Diagnosis not present

## 2021-08-01 DIAGNOSIS — R7989 Other specified abnormal findings of blood chemistry: Secondary | ICD-10-CM | POA: Insufficient documentation

## 2021-08-01 DIAGNOSIS — R635 Abnormal weight gain: Secondary | ICD-10-CM | POA: Diagnosis not present

## 2021-08-01 DIAGNOSIS — Z01419 Encounter for gynecological examination (general) (routine) without abnormal findings: Secondary | ICD-10-CM

## 2021-08-01 DIAGNOSIS — Z0000 Encounter for general adult medical examination without abnormal findings: Secondary | ICD-10-CM

## 2021-08-01 DIAGNOSIS — R030 Elevated blood-pressure reading, without diagnosis of hypertension: Secondary | ICD-10-CM | POA: Diagnosis not present

## 2021-08-01 DIAGNOSIS — E669 Obesity, unspecified: Secondary | ICD-10-CM

## 2021-08-01 DIAGNOSIS — E785 Hyperlipidemia, unspecified: Secondary | ICD-10-CM

## 2021-08-01 LAB — COMPREHENSIVE METABOLIC PANEL
ALT: 58 U/L — ABNORMAL HIGH (ref 0–35)
AST: 41 U/L — ABNORMAL HIGH (ref 0–37)
Albumin: 4.5 g/dL (ref 3.5–5.2)
Alkaline Phosphatase: 38 U/L — ABNORMAL LOW (ref 39–117)
BUN: 13 mg/dL (ref 6–23)
CO2: 29 mEq/L (ref 19–32)
Calcium: 9.6 mg/dL (ref 8.4–10.5)
Chloride: 102 mEq/L (ref 96–112)
Creatinine, Ser: 0.93 mg/dL (ref 0.40–1.20)
GFR: 76.21 mL/min (ref >60.00)
Glucose, Bld: 88 mg/dL (ref 70–99)
Potassium: 4 mEq/L (ref 3.5–5.1)
Sodium: 136 mEq/L (ref 135–145)
Total Bilirubin: 0.5 mg/dL (ref 0.2–1.2)
Total Protein: 7.6 g/dL (ref 6.0–8.3)

## 2021-08-01 LAB — LIPID PANEL
Cholesterol: 220 mg/dL — ABNORMAL HIGH (ref 0–200)
HDL: 49.6 mg/dL (ref >39.00)
LDL Cholesterol: 141 mg/dL — ABNORMAL HIGH (ref 0–99)
NonHDL: 170.19
Total CHOL/HDL Ratio: 4
Triglycerides: 145 mg/dL (ref 0.0–149.0)
VLDL: 29 mg/dL (ref 0.0–40.0)

## 2021-08-01 LAB — TSH: TSH: 1.41 u[IU]/mL (ref 0.35–5.50)

## 2021-08-01 MED ORDER — SUMATRIPTAN SUCCINATE 50 MG PO TABS
50.0000 mg | ORAL_TABLET | ORAL | 5 refills | Status: DC | PRN
  Filled 2021-08-01: qty 10, 30d supply, fill #0
  Filled 2021-12-12: qty 10, 30d supply, fill #1

## 2021-08-01 NOTE — Assessment & Plan Note (Signed)
BP Readings from Last 3 Encounters:  08/01/21 (!) 146/92  10/06/19 118/80  05/26/19 135/87   BP elevated today. Plan to bring her back in 1 month for recheck of BP.

## 2021-08-01 NOTE — Addendum Note (Signed)
Addended by: Wilford Corner on: 08/01/2021 02:22 PM   Modules accepted: Orders

## 2021-08-01 NOTE — Assessment & Plan Note (Signed)
Wt Readings from Last 3 Encounters:  08/01/21 200 lb (90.7 kg)  10/06/19 191 lb (86.6 kg)  05/26/19 192 lb (87.1 kg)   Discussed healthy diet, exercise, weight loss.  She declines covid vaccine.  She will schedule mammogram. Pap performed today. Labs as ordered.

## 2021-08-01 NOTE — Assessment & Plan Note (Signed)
Stable. Continue prn imitrex.

## 2021-08-01 NOTE — Patient Instructions (Signed)
Please complete lab work prior to leaving. Reschedule mammogram at your earliest convenience.

## 2021-08-01 NOTE — Telephone Encounter (Addendum)
Please advise pt that her liver function testing is elevated. Could be due to fatty liver in the setting of her recent weight gain. I would like for her to complete an [place] to look at her liver.   Also, cholesterol is elevated. Please work on low fat/low cholesterol diet/exercise and weight loss.

## 2021-08-01 NOTE — Progress Notes (Addendum)
Subjective:   By signing my name below, I, Kellie Simmering, attest that this documentation has been prepared under the direction and in the presence of Debbrah Alar, NP 08/01/2021      Patient ID: Kathy [name], female    DOB: 1980/12/26, 41 y.o.   MRN: [id number]  Chief Complaint  Patient presents with   Annual Exam         HPI Patient is in today for a comprehensive physical exam.  Refill- She is requesting a refill on her migraine management medication, Imitrex 50 mg.  Reports no recent migraines but she likes to have on hand.   She denies having any fever, new moles, congestion, sinus pain, sore throat, chest pain, palpitations, cough, shortness of breath, wheezing, nausea, vomiting, diarrhea, constipation, dysuria, frequency, abdominal pain, hematuria, new muscle pain, new joint pain, headaches.   Family history- She reports no changes to her family history. Social history: She does not drink alcoholic beverages and does not consume tobacco or vape products.  Immunizations: She is UTD on her tetanus immunizations. Diet: She reports that her diet is not doing well. She denies stress eating. Exercise: She walks her dog to stay active. Pap Smear: Last completed in 2019. Mammogram: Last completed on 02/15/2019. Dental: Dental care is UTD. Vision: Vision care is UTD.  Weight- Her weight has increased around 10 lbs since her last visit. Wt Readings from Last 3 Encounters:  08/01/21 200 lb (90.7 kg)  10/06/19 191 lb (86.6 kg)  05/26/19 192 lb (87.1 kg)     Past Medical History:  Diagnosis Date   History of chicken pox    Migraine 07/2014    Past Surgical History:  Procedure Laterality Date   CHOLECYSTECTOMY  2002   TUBAL LIGATION  2002    Family History  Problem Relation Age of Onset   Breast cancer Mother    Hypertension Father    Arthritis Maternal Grandmother    Diabetes Maternal Grandmother    Congestive Heart Failure Maternal Grandmother     Diabetes Mellitus II Maternal Grandfather    Diabetes Mellitus II Paternal Grandmother    COPD Paternal Grandfather     Social History   Socioeconomic History   Marital status: Married    Spouse name: Not on file   Number of children: Not on file   Years of education: Not on file   Highest education level: Not on file  Occupational History   Not on file  Tobacco Use   Smoking status: Never   Smokeless tobacco: Never  Substance and Sexual Activity   Alcohol use: No   Drug use: No   Sexual activity: Not on file  Other Topics Concern   Not on file  Social History Narrative   Married   2 children    1998- son Thurmond Butts   2002- Donna Christen   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     SUMAtriptan (IMITREX)  50 MG tablet Take 1 tablet (50 mg total) by mouth every 2 (two) hours as needed for migraine. May repeat in 2 hours if headache persists or recurs. 10 tablet 5   buPROPion (WELLBUTRIN XL) 150 MG 24 hr tablet Take 1 tablet (150 mg total) by mouth daily. 30 tablet 1   No facility-administered medications prior to visit.    No Known Allergies  Review of Systems  Constitutional:  Negative for chills and fever.  HENT:  Negative for congestion, sinus pain and sore throat.   Respiratory:  Negative for cough, shortness of breath and wheezing.   Cardiovascular:  Negative for chest pain.  Gastrointestinal:  Negative for abdominal pain, constipation, diarrhea, nausea and vomiting.  Genitourinary:   Negative for dysuria, frequency, hematuria and urgency.  Musculoskeletal:  Negative for joint pain and myalgias.  Skin:  Negative for itching and rash.       (-) new moles  Neurological:  Negative for headaches.  Psychiatric/Behavioral:  Negative for depression.        Objective:    Physical Exam Constitutional:      General: She is not in acute distress.    Appearance: Normal appearance. She is not ill-appearing.  HENT:     Head: Normocephalic and atraumatic.     Right Ear: Tympanic membrane, ear canal and external ear normal.     Left Ear: Tympanic membrane, ear canal and external ear normal.  Eyes:     Extraocular Movements: Extraocular movements intact.     Right eye: No nystagmus.     Left eye: No nystagmus.     Pupils: Pupils are equal, round, and reactive to light.  Cardiovascular:     Rate and Rhythm: Normal rate and regular rhythm.     Pulses: Normal pulses.     Heart sounds: Normal heart sounds. No murmur heard.    No gallop.  Pulmonary:     Effort: Pulmonary effort is normal. No respiratory distress.     Breath sounds: Normal breath sounds. No wheezing or rales.  Chest:  Breasts:    Breasts are symmetrical.     Right: Normal. No inverted nipple or mass.     Left: Normal. No inverted nipple or mass.  Abdominal:     General: Bowel sounds are normal.     Palpations: Abdomen is soft.     Tenderness: There is no abdominal tenderness. There is no guarding.  Genitourinary:    Vagina: Normal.  Musculoskeletal:     Comments: Muscle strength 5/5 on upper and lower extremities.  Lymphadenopathy:     Cervical: No cervical adenopathy.  Skin:    General: Skin is warm and dry.  Neurological:     Mental Status: She is alert and oriented to person, place, and time.     Deep Tendon Reflexes:     Reflex Scores:      Patellar reflexes are 2+ on the right side and 2+ on the left side. Psychiatric:        Mood and Affect: Mood normal.        Behavior: Behavior normal.         Judgment: Judgment normal.   Breasts: Examined lying and sitting.  Right: Without masses, retractions, discharge or axillary adenopathy.  Left: Without masses, retractions, discharge or axillary adenopathy.  Inguinal/mons: Normal without inguinal adenopathy  External genitalia: Normal  BUS/Urethra/Skene's glands: Normal  Bladder: Normal  Vagina: Normal  Cervix: Normal  Uterus: normal in size, shape and contour. Midline  and mobile  Adnexa/parametria:  Rt: Without masses or tenderness.  Lt: Without masses or tenderness.  Anus and perineum: Normal   BP 140/90   Pulse 72   Temp 98.4 F (36.9 C) (Oral)   Resp 16   Ht 5' 5" (1.651 m)   Wt 200 lb (90.7 kg)   SpO2 100%   BMI 33.28 kg/m  Wt Readings from Last 3 Encounters:  08/01/21 200 lb (90.7 kg)  10/06/19 191 lb (86.6 kg)  05/26/19 192 lb (87.1 kg)    Diabetic Foot Exam - Simple   No data filed      Assessment & Plan:   Problem List Items Addressed This Visit       Unprioritized   Preventative health care - Primary    Wt Readings from Last 3 Encounters:  08/01/21 200 lb (90.7 kg)  10/06/19 191 lb (86.6 kg)  05/26/19 192 lb (87.1 kg)  Discussed healthy diet, exercise, weight loss.  She declines covid vaccine.  She will schedule mammogram. Pap performed today. Labs as ordered.       Relevant Orders   Comp Met (CMET)   Migraine    Stable. Continue prn imitrex.       Relevant Medications   SUMAtriptan (IMITREX) 50 MG tablet   Elevated blood pressure reading    BP Readings from Last 3 Encounters:  08/01/21 (!) 146/92  10/06/19 118/80  05/26/19 135/87  BP elevated today. Plan to bring her back in 1 month for recheck of BP.        Relevant Orders   Comp Met (CMET)   Other Visit Diagnoses     Obesity (BMI 30.0-34.9)       Relevant Orders   Lipid panel   Weight gain       Relevant Orders   TSH      Meds ordered this encounter  Medications   SUMAtriptan (IMITREX) 50 MG tablet    Sig: Take 1  tablet (50 mg total) by mouth every 2 (two) hours as needed for migraine. May repeat in 2 hours if headache persists or recurs.    Dispense:  10 tablet    Refill:  5    Order Specific Question:   Supervising Provider    Answer:   Penni Homans A [[id number]]    I, Nance Pear, NP, personally preformed the services described in this documentation.  All medical record entries made by the scribe were at my direction and in my presence.  I have reviewed the chart and discharge instructions (if applicable) and agree that the record reflects my personal performance and is accurate and complete. 08/01/2021.  I,Mohammed Iqbal,acting as a Education administrator for [employer], NP.,have documented all relevant documentation on the behalf of Nance Pear, NP,as directed by  Nance Pear, NP while in the presence of Nance Pear, NP.  Nance Pear, NP

## 2021-08-01 NOTE — Telephone Encounter (Signed)
Called but no answer, lvm for patient to call back 

## 2021-08-03 NOTE — Telephone Encounter (Signed)
Patient advised of results and provider's comments. She verbalized understanding.

## 2021-08-07 LAB — CYTOLOGY - PAP
Adequacy: ABSENT
Comment: NEGATIVE
Diagnosis: NEGATIVE
High risk HPV: NEGATIVE

## 2021-08-09 ENCOUNTER — Ambulatory Visit (HOSPITAL_BASED_OUTPATIENT_CLINIC_OR_DEPARTMENT_OTHER)
Admission: RE | Admit: 2021-08-09 | Discharge: 2021-08-09 | Disposition: A | Discharge status: Home / Self Care | Payer: 59 | Source: Ambulatory Visit | Attending: Family | Admitting: Family

## 2021-08-09 ENCOUNTER — Telehealth: Payer: Self-pay | Admitting: Family

## 2021-08-09 DIAGNOSIS — R7989 Other specified abnormal findings of blood chemistry: Secondary | ICD-10-CM

## 2021-08-09 DIAGNOSIS — R945 Abnormal results of liver function studies: Secondary | ICD-10-CM | POA: Diagnosis not present

## 2021-08-09 NOTE — Telephone Encounter (Signed)
Please advise pt that her liver looks normal on ultrasound.  I would like for her to repeat her liver testing in 1 month.

## 2021-08-10 NOTE — Telephone Encounter (Signed)
Called but no answer, lvm for patient to call back 

## 2021-08-10 NOTE — Telephone Encounter (Signed)
Patient was advise of results and she was scheduled to come back in one month for bp recheck (at her request) and liver testing.

## 2021-08-16 ENCOUNTER — Ambulatory Visit
Admission: RE | Admit: 2021-08-16 | Discharge: 2021-08-16 | Disposition: A | Discharge status: Home / Self Care | Payer: 59 | Source: Ambulatory Visit | Attending: Family | Admitting: Family

## 2021-08-16 DIAGNOSIS — Z09 Encounter for follow-up examination after completed treatment for conditions other than malignant neoplasm: Secondary | ICD-10-CM

## 2021-08-16 DIAGNOSIS — R922 Inconclusive mammogram: Secondary | ICD-10-CM | POA: Diagnosis not present

## 2021-09-11 ENCOUNTER — Ambulatory Visit: Payer: 59 | Admitting: Family

## 2021-09-11 ENCOUNTER — Other Ambulatory Visit (HOSPITAL_BASED_OUTPATIENT_CLINIC_OR_DEPARTMENT_OTHER): Payer: Self-pay

## 2021-09-11 VITALS — BP 151/90 | HR 81 | Temp 98.6°F | Resp 16 | Wt 198.0 lb

## 2021-09-11 DIAGNOSIS — I10 Essential (primary) hypertension: Secondary | ICD-10-CM | POA: Diagnosis not present

## 2021-09-11 DIAGNOSIS — R400 Somnolence: Secondary | ICD-10-CM | POA: Diagnosis not present

## 2021-09-11 DIAGNOSIS — R7989 Other specified abnormal findings of blood chemistry: Secondary | ICD-10-CM | POA: Diagnosis not present

## 2021-09-11 MED ORDER — AMLODIPINE BESYLATE 5 MG PO TABS
5.0000 mg | ORAL_TABLET | Freq: Every day | ORAL | 0 refills | Status: DC
  Filled 2021-09-11: qty 90, 90d supply, fill #0
  Filled 2021-12-14: qty 3, 3d supply, fill #1

## 2021-09-11 NOTE — Assessment & Plan Note (Signed)
Labs as ordered.  Had normal abd [place].  If still elevated consider referral to GI.

## 2021-09-11 NOTE — Progress Notes (Signed)
Subjective:     Patient ID: Kathy [name], female    DOB: 1981-01-11, 41 y.o.   MRN: [id number]  No chief complaint on file.   HPI Patient is in today for follow up.   HTN-  BP Readings from Last 3 Encounters:  09/11/21 (!) 151/90  08/01/21 140/90  10/06/19 118/80   Abnormal LFT's- noted last visit. Had unremarkable abdominal US.   Reports + fatigue, all the time. She does snore.    Health Maintenance Due  Topic Date Due   COVID-19 Vaccine (1) Never done   INFLUENZA VACCINE  08/21/2021    Past Medical History:  Diagnosis Date   History of chicken pox    Migraine 07/2014    Past Surgical History:  Procedure Laterality Date   CHOLECYSTECTOMY  2002   TUBAL LIGATION  2002    Family History  Problem Relation Age of Onset   Breast cancer Mother    Hypertension Father    Arthritis Maternal Grandmother    Diabetes Maternal Grandmother    Congestive Heart Failure Maternal Grandmother    Diabetes Mellitus II Maternal Grandfather    Diabetes Mellitus II Paternal Grandmother    COPD Paternal Grandfather     Social History   Socioeconomic History   Marital status: Married    Spouse name: Not on file   Number of children: Not on file   Years of education: Not on file   Highest education level: Not on file  Occupational History   Not on file  Tobacco Use   Smoking status: Never   Smokeless tobacco: Never  Substance and Sexual Activity   Alcohol use: No   Drug use: No   Sexual activity: Not on file  Other Topics Concern   Not on file  Social History Narrative   Married   2 children    1998- son Alycia Rossetti   2002- Gerre Pebbles   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     SUMAtriptan (IMITREX) 50 MG tablet Take 1 tablet (50 mg total) by mouth every 2 (two) hours as needed for migraine. May repeat in 2 hours if headache persists or recurs. 10 tablet 5   No facility-administered medications prior to visit.    No Known Allergies  ROS See HPI    Objective:    Physical Exam Constitutional:      General: She is not in acute distress.    Appearance: Normal appearance. She is well-developed.  HENT:     Head: Normocephalic and atraumatic.     Right Ear: External ear normal.     Left Ear: External ear normal.  Eyes:     General: No scleral icterus. Neck:     Thyroid: No thyromegaly.  Cardiovascular:     Rate and Rhythm: Normal rate and regular rhythm.     Heart sounds: Normal heart sounds. No murmur heard. Pulmonary:     Effort: Pulmonary effort  is normal. No respiratory distress.     Breath sounds: Normal breath sounds. No wheezing.  Musculoskeletal:     Cervical back: Neck supple.  Skin:    General: Skin is warm and dry.  Neurological:     Mental Status: She is alert and oriented to person, place, and time.  Psychiatric:        Mood and Affect: Mood normal.        Behavior: Behavior normal.        Thought Content: Thought content normal.        Judgment: Judgment normal.     BP (!) 151/90 (BP Location: Right Arm, Patient Position: Sitting, Cuff Size: Large)   Pulse 81   Temp 98.6 F (37 C) (Oral)   Resp 16   Wt 198 lb (89.8 kg)   SpO2 100%   BMI 32.95 kg/m  Wt Readings from Last 3 Encounters:  09/11/21 198 lb (89.8 kg)  08/01/21 200 lb (90.7 kg)  10/06/19 191 lb (86.6 kg)       Assessment & Plan:   Problem List Items Addressed This Visit       Unprioritized   Hypertension    She has had 2 readings in a row in HTN range.  We  discussed importance of a low sodium diet/exercise, weight loss. Will also add amlodipine 5mg  once daily.       Relevant Medications   amLODipine (NORVASC) 5 MG tablet   Daytime somnolence    I am concerned about the possibility of OSA given her hx. Will refer for further evaluation/sleep testing.       Relevant Orders   Ambulatory referral to Pulmonology   Abnormal LFTs - Primary    Labs as ordered.  Had normal abd .  If still elevated consider referral to GI.       Relevant Orders   Hepatic function panel   Hepatitis, Acute    I am having Dashanna M. Dikes start on amLODipine. I am also having her maintain her ondansetron and SUMAtriptan.  Meds ordered this encounter  Medications   amLODipine (NORVASC) 5 MG tablet    Sig: Take 1 tablet (5 mg total) by mouth daily.    Dispense:  90 tablet    Refill:  0    Order Specific Question:   Supervising Provider    Answer:   [place] A [[id number]]

## 2021-09-11 NOTE — Assessment & Plan Note (Signed)
I am concerned about the possibility of OSA given her hx. Will refer for further evaluation/sleep testing.

## 2021-09-11 NOTE — Patient Instructions (Signed)
-   Please start amlodipine

## 2021-09-11 NOTE — Assessment & Plan Note (Signed)
She has had 2 readings in a row in HTN range.  We discussed importance of a low sodium diet/exercise, weight loss. Will also add amlodipine 5mg  once daily.

## 2021-09-12 LAB — HEPATIC FUNCTION PANEL
ALT: 24 U/L (ref 0–35)
AST: 22 U/L (ref 0–37)
Albumin: 4.2 g/dL (ref 3.5–5.2)
Alkaline Phosphatase: 39 U/L (ref 39–117)
Bilirubin, Direct: 0.1 mg/dL (ref 0.0–0.3)
Total Bilirubin: 0.4 mg/dL (ref 0.2–1.2)
Total Protein: 7.3 g/dL (ref 6.0–8.3)

## 2021-09-12 LAB — HEPATITIS PANEL, ACUTE
Hep A IgM: NONREACTIVE
Hep B C IgM: NONREACTIVE
Hepatitis B Surface Ag: NONREACTIVE
Hepatitis C Ab: NONREACTIVE

## 2021-09-25 ENCOUNTER — Institutional Professional Consult (permissible substitution): Payer: 59 | Admitting: Primary Care

## 2021-09-25 ENCOUNTER — Ambulatory Visit: Payer: 59 | Admitting: Family

## 2021-09-25 VITALS — BP 139/82 | HR 65 | Temp 98.2°F | Resp 16 | Wt 198.0 lb

## 2021-09-25 DIAGNOSIS — R400 Somnolence: Secondary | ICD-10-CM

## 2021-09-25 DIAGNOSIS — Z23 Encounter for immunization: Secondary | ICD-10-CM

## 2021-09-25 DIAGNOSIS — G43809 Other migraine, not intractable, without status migrainosus: Secondary | ICD-10-CM

## 2021-09-25 DIAGNOSIS — R7989 Other specified abnormal findings of blood chemistry: Secondary | ICD-10-CM

## 2021-09-25 DIAGNOSIS — I10 Essential (primary) hypertension: Secondary | ICD-10-CM

## 2021-09-25 NOTE — Assessment & Plan Note (Signed)
BP Readings from Last 3 Encounters:  09/25/21 139/82  09/11/21 (!) 151/90  08/01/21 140/90   Stable/improved.  Continue amlodipine 5mg .

## 2021-09-25 NOTE — Assessment & Plan Note (Signed)
Most recent LFT's had returned to normal. Perhaps she had a gastroenteritis the day they were elevated.  Monitor.

## 2021-09-25 NOTE — Assessment & Plan Note (Signed)
Stable, has imitrex and zofran on hand for prn use.

## 2021-09-25 NOTE — Progress Notes (Signed)
Subjective:   By signing my name below, I, Cassell Clement, attest that this documentation has been prepared under the direction and in the presence of Birdie Sons, NP 09/25/2021   Patient ID: Kathy [name], female    DOB: 08/25/1980, 41 y.o.   MRN: [id number]  Chief Complaint  Patient presents with   Hypertension    Here for follow up    HPI Patient is in today for an office visit  Blood Pressure: As of today's visit, her blood pressure is normal. She is currently taking 5 Mg of Amlodipine BP Readings from Last 3 Encounters:  09/25/21 139/82  09/11/21 (!) 151/90  08/01/21 140/90   Pulse Readings from Last 3 Encounters:  09/25/21 65  09/11/21 81  08/01/21 72   Sleep Study: She reports that she has to reschedule her sleep study. Migraines: She states that she has not had migraines in a long time. She has 50 Mg of Imitrex just in case her migraine reappears Immunizations: She is interested in receiving her influenza vaccine during today's visit. Liver Function: Her liver function is stabilizing. Lab Results  Component Value Date   ALT 24 09/11/2021   AST 22 09/11/2021   ALKPHOS 39 09/11/2021   BILITOT 0.4 09/11/2021    Health Maintenance Due  Topic Date Due   COVID-19 Vaccine (1) Never done   INFLUENZA VACCINE  08/21/2021    Past Medical History:  Diagnosis Date   History of chicken pox    Migraine 07/2014    Past Surgical History:  Procedure Laterality Date   CHOLECYSTECTOMY  2002   TUBAL LIGATION  2002    Family History  Problem Relation Age of Onset   Breast cancer Mother    Hypertension Father    Arthritis Maternal Grandmother    Diabetes Maternal Grandmother    Congestive Heart Failure Maternal Grandmother    Diabetes Mellitus II Maternal Grandfather    Diabetes Mellitus II Paternal Grandmother    COPD Paternal Grandfather     Social History   Socioeconomic History   Marital status: Married    Spouse name: Not on file   Number  of children: Not on file   Years of education: Not on file   Highest education level: Not on file  Occupational History   Not on file  Tobacco Use   Smoking status: Never   Smokeless tobacco: Never  Substance and Sexual Activity   Alcohol use: No   Drug use: No   Sexual activity: Not on file  Other Topics Concern   Not on file  Social History Narrative   Married   2 children    1998- son Alycia Rossetti   2002- Gerre Pebbles   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 90 tablet 0   ondansetron (ZOFRAN) 4 MG tablet Take 4 mg by mouth every 8 (eight) hours as needed for nausea or vomiting.     SUMAtriptan (IMITREX) 50 MG tablet Take 1 tablet (  50 mg total) by mouth every 2 (two) hours as needed for migraine. May repeat in 2 hours if headache persists or recurs. 10 tablet 5   No facility-administered medications prior to visit.    No Known Allergies  ROS See HPI    Objective:    Physical Exam Constitutional:      General: She is not in acute distress.    Appearance: Normal appearance. She is not ill-appearing.  HENT:     Head: Normocephalic and atraumatic.     Right Ear: External ear normal.     Left Ear: External ear normal.  Eyes:     Extraocular Movements: Extraocular movements intact.     Pupils: Pupils are equal, round, and reactive to light.  Cardiovascular:     Rate and Rhythm: Normal rate and regular rhythm.     Heart sounds: Normal heart sounds. No murmur heard.    No gallop.  Pulmonary:     Effort: Pulmonary effort is  normal. No respiratory distress.     Breath sounds: Normal breath sounds. No wheezing or rales.  Skin:    General: Skin is warm and dry.  Neurological:     Mental Status: She is alert and oriented to person, place, and time.  Psychiatric:        Mood and Affect: Mood normal.        Behavior: Behavior normal.        Judgment: Judgment normal.     BP 139/82   Pulse 65   Temp 98.2 F (36.8 C) (Oral)   Resp 16   Wt 198 lb (89.8 kg)   SpO2 100%   BMI 32.95 kg/m  Wt Readings from Last 3 Encounters:  09/25/21 198 lb (89.8 kg)  09/11/21 198 lb (89.8 kg)  08/01/21 200 lb (90.7 kg)       Assessment & Plan:   Problem List Items Addressed This Visit       Unprioritized   Migraine    Stable, has imitrex and zofran on hand for prn use.       Hypertension    BP Readings from Last 3 Encounters:  09/25/21 139/82  09/11/21 (!) 151/90  08/01/21 140/90  Stable/improved.  Continue amlodipine 5mg .       Daytime somnolence    She will reschedule her Sleep Consult.       Abnormal LFTs    Most recent LFT's had returned to normal. Perhaps she had a gastroenteritis the day they were elevated.  Monitor.       Flu shot today.   I, , NP, personally preformed the services described in this documentation.  All medical record entries made by the scribe were at my direction and in my presence.  I have reviewed the chart and discharge instructions (if applicable) and agree that the record reflects my personal performance and is accurate and complete. 09/25/2021   I,Amber Collins,acting as a scribe for 11/25/2021, NP.,have documented all relevant documentation on the behalf of Lemont Fillers, NP,as directed by  Lemont Fillers, NP while in the presence of Lemont Fillers, NP.    Lemont Fillers, NP

## 2021-09-25 NOTE — Assessment & Plan Note (Signed)
She will reschedule her Sleep Consult.

## 2021-11-09 ENCOUNTER — Encounter: Payer: Self-pay | Admitting: Family

## 2021-11-28 DIAGNOSIS — H5213 Myopia, bilateral: Secondary | ICD-10-CM | POA: Diagnosis not present

## 2021-11-28 DIAGNOSIS — H40013 Open angle with borderline findings, low risk, bilateral: Secondary | ICD-10-CM | POA: Diagnosis not present

## 2021-11-28 DIAGNOSIS — H52223 Regular astigmatism, bilateral: Secondary | ICD-10-CM | POA: Diagnosis not present

## 2021-12-12 ENCOUNTER — Other Ambulatory Visit: Payer: Self-pay | Admitting: Family

## 2021-12-14 ENCOUNTER — Other Ambulatory Visit (HOSPITAL_BASED_OUTPATIENT_CLINIC_OR_DEPARTMENT_OTHER): Payer: Self-pay

## 2021-12-15 ENCOUNTER — Other Ambulatory Visit (HOSPITAL_BASED_OUTPATIENT_CLINIC_OR_DEPARTMENT_OTHER): Payer: Self-pay

## 2021-12-15 MED ORDER — AMLODIPINE BESYLATE 5 MG PO TABS
5.0000 mg | ORAL_TABLET | Freq: Every day | ORAL | 0 refills | Status: DC
  Filled 2021-12-15: qty 90, 90d supply, fill #0

## 2021-12-17 ENCOUNTER — Other Ambulatory Visit (HOSPITAL_BASED_OUTPATIENT_CLINIC_OR_DEPARTMENT_OTHER): Payer: Self-pay

## 2021-12-17 ENCOUNTER — Encounter: Payer: Self-pay | Admitting: Family

## 2021-12-17 MED ORDER — AMLODIPINE BESYLATE 5 MG PO TABS: 5.0000 mg | ORAL_TABLET | Freq: Every day | ORAL | 0 refills | Status: DC

## 2021-12-25 ENCOUNTER — Ambulatory Visit: Payer: 59 | Admitting: Family

## 2022-03-27 ENCOUNTER — Other Ambulatory Visit: Payer: Self-pay | Admitting: Family

## 2022-03-27 NOTE — Telephone Encounter (Signed)
Please contact pt to schedule follow up.

## 2022-03-27 NOTE — Telephone Encounter (Signed)
Lvm to schedule

## 2022-04-29 NOTE — Progress Notes (Signed)
Subjective:   By signing my name below, I, Kathy [name], attest that this documentation has been prepared under the direction and in the presence of Lemont Fillers, NP 05/01/22   Patient ID: Kathy [name], female    DOB: [birth year], 41 y.o.   MRN: [id number]  Chief Complaint  Patient presents with   Hypertension    Here for follow up    HPI Patient is in today for a follow up.   Hypertension:  She is compliant with her 5 mg Amlodipine once daily. She denies any swelling or other side effects. She does not monitor her blood pressure at home. She has been working on her diet and exercising  more since January.  BP Readings from Last 3 Encounters:  04/30/22 139/80  09/25/21 139/82  09/11/21 (!) 151/90   Insomnia: She tends to be sleepy during the day time due to sleep habits. She does not sleep well at night, which she believes may be attributed to her dog sleeping with her at night. Her dog tends to kick while asleep. She has noticed that she wakes up every time her dog moves.   Migraines: She has not had a migraine in a while but continues to get her medication refilled just in case.   Past Medical History:  Diagnosis Date   History of chicken pox    Migraine 07/2014    Past Surgical History:  Procedure Laterality Date   CHOLECYSTECTOMY  2002   TUBAL LIGATION  2002    Family History  Problem Relation Age of Onset   Breast cancer Mother    Hypertension Father    Arthritis Maternal Grandmother    Diabetes Maternal Grandmother    Congestive Heart Failure Maternal Grandmother    Diabetes Mellitus II Maternal Grandfather    Diabetes Mellitus II Paternal Grandmother    COPD Paternal Grandfather     Social History   Socioeconomic History   Marital status: Married    Spouse name: Not on file   Number of children: Not on file   Years of education: Not on file   Highest education level: Associate degree: academic program  Occupational History   Not on  file  Tobacco Use   Smoking status: Never   Smokeless tobacco: Never  Substance and Sexual Activity   Alcohol use: No   Drug use: No   Sexual activity: Not on file  Other Topics Concern   Not on file  Social History Narrative   Married   2 children    1998- son Alycia Rossetti   2002- Gerre Pebbles   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    Enjoys sleeping, going to pool   Social Determinants of Corporate investment banker Strain: Low Risk  (04/30/2022)   Overall Financial Resource Strain (CARDIA)    Difficulty of Paying Living Expenses: Not hard at all  Food Insecurity: No Food Insecurity (04/30/2022)   Hunger Vital Sign    Worried About Running Out of Food in the Last Year: Never true    Ran Out of Food in the Last Year: Never true  Transportation Needs: No Transportation Needs (04/30/2022)   PRAPARE - Administrator, Civil Service (Medical): No    Lack of Transportation (Non-Medical): No  Physical Activity: Sufficiently Active (04/30/2022)   Exercise Vital Sign    Days of Exercise per Week: 5 days    Minutes of Exercise per Session: 60 min  Stress: No Stress  Concern Present (04/30/2022)   [employer] of Occupational Health - Occupational Stress Questionnaire    Feeling of Stress : Not at all  Social Connections: Socially Integrated (04/30/2022)   Social Connection and Isolation Panel [NHANES]    Frequency of Communication with Friends and Family: More than three times a week    Frequency of Social Gatherings with Friends and Family: Patient declined    Attends Religious Services: More than 4 times per year    Active Member of [employer] or Organizations: Yes    Attends Engineer, structural: More than 4 times per year    Marital Status: Married  Catering manager Violence: Not on file    Outpatient Medications Prior to Visit  Medication Sig Dispense Refill   ondansetron (ZOFRAN) 4 MG tablet Take 4 mg by mouth every 8 (eight) hours as needed for nausea or vomiting.      SUMAtriptan (IMITREX) 50 MG tablet Take 1 tablet (50 mg total) by mouth every 2 (two) hours as needed for migraine. May repeat in 2 hours if headache persists or recurs. 10 tablet 5   amLODipine (NORVASC) 5 MG tablet TAKE 1 TABLET (5 MG TOTAL) BY MOUTH DAILY. 90 tablet 0   No facility-administered medications prior to visit.    No Known Allergies  Review of Systems  Psychiatric/Behavioral:  The patient has insomnia.        Objective:    Physical Exam Constitutional:      General: She is not in acute distress.    Appearance: Normal appearance. She is well-developed.  HENT:     Head: Normocephalic and atraumatic.     Right Ear: External ear normal.     Left Ear: External ear normal.  Eyes:     General: No scleral icterus. Neck:     Thyroid: No thyromegaly.  Cardiovascular:     Rate and Rhythm: Normal rate and regular rhythm.     Heart sounds: Normal heart sounds. No murmur heard. Pulmonary:     Effort: Pulmonary effort is normal. No respiratory distress.     Breath sounds: Normal breath sounds. No wheezing.  Musculoskeletal:     Cervical back: Neck supple.  Skin:    General: Skin is warm and dry.  Neurological:     Mental Status: She is alert and oriented to person, place, and time.  Psychiatric:        Mood and Affect: Mood normal.        Behavior: Behavior normal.        Thought Content: Thought content normal.        Judgment: Judgment normal.     BP 139/80 (BP Location: Right Arm, Patient Position: Sitting, Cuff Size: Large)   Pulse 71   Temp 98.2 F (36.8 C) (Oral)   Resp 16   Wt 195 lb (88.5 kg)   SpO2 100%   BMI 32.45 kg/m  Wt Readings from Last 3 Encounters:  04/30/22 195 lb (88.5 kg)  09/25/21 198 lb (89.8 kg)  09/11/21 198 lb (89.8 kg)       Assessment & Plan:  Primary hypertension Assessment & Plan: Wt Readings from Last 3 Encounters:  04/30/22 195 lb (88.5 kg)  09/25/21 198 lb (89.8 kg)  09/11/21 198 lb (89.8 kg)   Working on diet  and exercise. Continue amlodipine 5mg  once daily.    Daytime somnolence Assessment & Plan: Encouraged her to remove her dog from her bed at night.  If daytime sleepiness continues after  she makes this change, she will let me know and we try to re-initiate her sleep consultation.    Abnormal LFTs Assessment & Plan: Repeated back in September and had returned to normal.    Other migraine without status migrainosus, not intractable Assessment & Plan: No recent migraines but she likes to keep imitrex and zofran on hand just in case- continue same.    History of motion sickness Assessment & Plan: Patient is requesting scopolamine patch for her upcoming 7 day cruise.  Rx has been sent.    Other orders -     Scopolamine; Place 1 patch (1.5 mg total) onto the skin every 3 (three) days.  Dispense: 4 patch; Refill: 0 -     amLODIPine Besylate; Take 1 tablet (5 mg total) by mouth daily.  Dispense: 90 tablet; Refill: 0     I,Rachel Rivera,acting as a scribe for Lemont Fillers, NP.,have documented all relevant documentation on the behalf of Lemont Fillers, NP,as directed by  Lemont Fillers, NP while in the presence of Lemont Fillers, NP.   I, Lemont Fillers, NP, personally preformed the services described in this documentation.  All medical record entries made by the scribe were at my direction and in my presence.  I have reviewed the chart and discharge instructions (if applicable) and agree that the record reflects my personal performance and is accurate and complete. 05/01/22   Lemont Fillers, NP

## 2022-04-30 ENCOUNTER — Ambulatory Visit: Payer: 59 | Admitting: Family

## 2022-04-30 VITALS — BP 139/80 | HR 71 | Temp 98.2°F | Resp 16 | Wt 195.0 lb

## 2022-04-30 DIAGNOSIS — R400 Somnolence: Secondary | ICD-10-CM | POA: Diagnosis not present

## 2022-04-30 DIAGNOSIS — R7989 Other specified abnormal findings of blood chemistry: Secondary | ICD-10-CM

## 2022-04-30 DIAGNOSIS — Z87898 Personal history of other specified conditions: Secondary | ICD-10-CM

## 2022-04-30 DIAGNOSIS — I10 Essential (primary) hypertension: Secondary | ICD-10-CM

## 2022-04-30 DIAGNOSIS — G43809 Other migraine, not intractable, without status migrainosus: Secondary | ICD-10-CM | POA: Diagnosis not present

## 2022-04-30 MED ORDER — AMLODIPINE BESYLATE 5 MG PO TABS: 5.0000 mg | ORAL_TABLET | Freq: Every day | ORAL | 0 refills | Status: DC

## 2022-04-30 MED ORDER — SCOPOLAMINE 1 MG/3DAYS TD PT72: 1.0000 | MEDICATED_PATCH | TRANSDERMAL | 0 refills | Status: DC

## 2022-04-30 NOTE — Assessment & Plan Note (Signed)
Wt Readings from Last 3 Encounters:  04/30/22 195 lb (88.5 kg)  09/25/21 198 lb (89.8 kg)  09/11/21 198 lb (89.8 kg)   Working on diet and exercise. Continue amlodipine 5mg  once daily.

## 2022-05-01 DIAGNOSIS — Z87898 Personal history of other specified conditions: Secondary | ICD-10-CM | POA: Insufficient documentation

## 2022-05-01 NOTE — Assessment & Plan Note (Signed)
Repeated back in September and had returned to normal.

## 2022-05-01 NOTE — Assessment & Plan Note (Signed)
Encouraged her to remove her dog from her bed at night.  If daytime sleepiness continues after she makes this change, she will let me know and we try to re-initiate her sleep consultation.

## 2022-05-01 NOTE — Assessment & Plan Note (Signed)
Patient is requesting scopolamine patch for her upcoming 7 day cruise.  Rx has been sent.

## 2022-05-01 NOTE — Assessment & Plan Note (Signed)
No recent migraines but she likes to keep imitrex and zofran on hand just in case- continue same.

## 2022-07-22 ENCOUNTER — Other Ambulatory Visit (HOSPITAL_COMMUNITY): Payer: Self-pay

## 2022-07-22 ENCOUNTER — Other Ambulatory Visit: Payer: Self-pay | Admitting: Family

## 2022-07-22 ENCOUNTER — Other Ambulatory Visit: Payer: Self-pay

## 2022-07-22 MED ORDER — AMLODIPINE BESYLATE 5 MG PO TABS
5.0000 mg | ORAL_TABLET | Freq: Every day | ORAL | 0 refills | Status: DC
  Filled 2022-07-22: qty 90, 90d supply, fill #0

## 2022-07-29 ENCOUNTER — Other Ambulatory Visit: Payer: Self-pay | Admitting: Family

## 2022-07-31 ENCOUNTER — Encounter: Payer: Self-pay | Admitting: Family

## 2022-08-06 ENCOUNTER — Ambulatory Visit: Payer: 59 | Admitting: Family

## 2022-08-06 VITALS — BP 139/83 | HR 88 | Temp 98.0°F | Wt 200.0 lb

## 2022-08-06 DIAGNOSIS — M7989 Other specified soft tissue disorders: Secondary | ICD-10-CM | POA: Insufficient documentation

## 2022-08-06 NOTE — Progress Notes (Signed)
Subjective:     Patient ID: Kathy [name], female    DOB: [birth year], 42 y.o.   MRN: [id number]  Chief Complaint  Patient presents with   Foot Swelling    Here for left foot swelling     HPI  Discussed the use of AI scribe software for clinical note transcription with the patient, who gave verbal consent to proceed.  History of Present Illness   The patient presents with left ankle and calf swelling that started about a month ago. She denies any history of injury to the affected leg. However, she recalls an incident in early May where she experienced a cramp in her calf while at a restaurant. Instead of stopping to stretch, she continued to walk, resulting in a persistent tightness in the calf. She is unsure if this incident is related to the current swelling. She did not notice the calf swelling until a friend pointed it out recently. She denies any associated pain, chest pain, or shortness of breath. She has not had any long travel trips recently. She is currently on amlodipine for blood pressure control.          Health Maintenance Due  Topic Date Due   HIV Screening  Never done   COVID-19 Vaccine (1 - 2023-24 season) Never done    Past Medical History:  Diagnosis Date   History of chicken pox    Migraine 07/2014    Past Surgical History:  Procedure Laterality Date   CHOLECYSTECTOMY  2002   TUBAL LIGATION  2002    Family History  Problem Relation Age of Onset   Breast cancer Mother    Hypertension Father    Arthritis Maternal Grandmother    Diabetes Maternal Grandmother    Congestive Heart Failure Maternal Grandmother    Diabetes Mellitus II Maternal Grandfather    Diabetes Mellitus II Paternal Grandmother    COPD Paternal Grandfather     Social History   Socioeconomic History   Marital status: Married    Spouse name: Not on file   Number of children: Not on file   Years of education: Not on file   Highest education level: Associate degree:  academic program  Occupational History   Not on file  Tobacco Use   Smoking status: Never   Smokeless tobacco: Never  Substance and Sexual Activity   Alcohol use: No   Drug use: No   Sexual activity: Not on file  Other Topics Concern   Not on file  Social History Narrative   Married   2 children    1998- son Alycia Rossetti   2002- Gerre Pebbles   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    Enjoys sleeping, going to pool   Social Determinants of Corporate investment banker Strain: Low Risk  (04/30/2022)   Overall Financial Resource Strain (CARDIA)    Difficulty of Paying Living Expenses: Not hard at all  Food Insecurity: No Food Insecurity (04/30/2022)   Hunger Vital Sign    Worried About Running Out of Food in the Last Year: Never true    Ran Out of Food in the Last Year: Never true  Transportation Needs: No Transportation Needs (04/30/2022)   PRAPARE - Administrator, Civil Service (Medical): No    Lack of Transportation (Non-Medical): No  Physical Activity: Sufficiently Active (04/30/2022)   Exercise Vital Sign    Days of Exercise per Week: 5 days    Minutes of Exercise per  Session: 60 min  Stress: No Stress Concern Present (04/30/2022)   [employer] of Occupational Health - Occupational Stress Questionnaire    Feeling of Stress : Not at all  Social Connections: Socially Integrated (04/30/2022)   Social Connection and Isolation Panel [NHANES]    Frequency of Communication with Friends and Family: More than three times a week    Frequency of Social Gatherings with Friends and Family: Patient declined    Attends Religious Services: More than 4 times per year    Active Member of [employer] or Organizations: Yes    Attends Engineer, structural: More than 4 times per year    Marital Status: Married  Catering manager Violence: Not on file    Outpatient Medications Prior to Visit  Medication Sig Dispense Refill   amLODipine (NORVASC) 5 MG tablet TAKE 1 TABLET (5 MG TOTAL)  BY MOUTH DAILY. 90 tablet 0   ondansetron (ZOFRAN) 4 MG tablet Take 4 mg by mouth every 8 (eight) hours as needed for nausea or vomiting.     scopolamine (TRANSDERM-SCOP) 1 MG/3DAYS Place 1 patch (1.5 mg total) onto the skin every 3 (three) days. 4 patch 0   SUMAtriptan (IMITREX) 50 MG tablet Take 1 tablet (50 mg total) by mouth every 2 (two) hours as needed for migraine. May repeat in 2 hours if headache persists or recurs. 10 tablet 5   No facility-administered medications prior to visit.    No Known Allergies  ROS     Objective:    Physical Exam Constitutional:      General: She is not in acute distress.    Appearance: Normal appearance. She is well-developed.  HENT:     Head: Normocephalic and atraumatic.     Right Ear: External ear normal.     Left Ear: External ear normal.  Eyes:     General: No scleral icterus. Neck:     Thyroid: No thyromegaly.  Cardiovascular:     Rate and Rhythm: Normal rate and regular rhythm.     Heart sounds: Normal heart sounds. No murmur heard. Pulmonary:     Effort: Pulmonary effort is normal. No respiratory distress.     Breath sounds: Normal breath sounds. No wheezing.  Musculoskeletal:     Cervical back: Neck supple.     Comments: Left ankle/leg more swollen then right  Skin:    General: Skin is warm and dry.  Neurological:     Mental Status: She is alert and oriented to person, place, and time.  Psychiatric:        Mood and Affect: Mood normal.        Behavior: Behavior normal.        Thought Content: Thought content normal.        Judgment: Judgment normal.       BP 139/83 (BP Location: Right Arm, Patient Position: Sitting, Cuff Size: Large)   Pulse 88   Temp 98 F (36.7 C) (Oral)   Wt 200 lb (90.7 kg)   SpO2 100%   BMI 33.28 kg/m  Wt Readings from Last 3 Encounters:  08/06/22 200 lb (90.7 kg)  04/30/22 195 lb (88.5 kg)  09/25/21 198 lb (89.8 kg)       Assessment & Plan:   Problem List Items Addressed This Visit        Unprioritized   Leg swelling - Primary    Swelling in left calf and ankle for approximately one month. No history of injury, no calf pain,  no chest pain or shortness of breath. No recent long travel. Patient is on Amlodipine which can cause swelling, but typically bilateral. No worsening pain or symptoms of pulmonary embolism. -Order lower extremity ultrasound to rule out deep vein thrombosis (DVT). -If ultrasound is negative, consider changing blood pressure medication due to potential side effect of Amlodipine causing swelling. -Instruct patient to go to ER if worsening pain, shortness of breath, or chest pain occur.       Relevant Orders   US Venous Img Lower Unilateral Left    I am having Jo-Anne M. Rohe maintain her ondansetron, SUMAtriptan, scopolamine, and amLODipine.  No orders of the defined types were placed in this encounter.

## 2022-08-06 NOTE — Assessment & Plan Note (Signed)
Swelling in left calf and ankle for approximately one month. No history of injury, no calf pain, no chest pain or shortness of breath. No recent long travel. Patient is on Amlodipine which can cause swelling, but typically bilateral. No worsening pain or symptoms of pulmonary embolism. -Order lower extremity ultrasound to rule out deep vein thrombosis (DVT). -If ultrasound is negative, consider changing blood pressure medication due to potential side effect of Amlodipine causing swelling. -Instruct patient to go to ER if worsening pain, shortness of breath, or chest pain occur.

## 2022-08-06 NOTE — Patient Instructions (Signed)
VISIT SUMMARY:  During your visit, we discussed the swelling in your left ankle and calf that started about a month ago. You mentioned that you didn't have any injury to the leg, but recalled a cramp in your calf in early May. You are currently taking amlodipine for blood pressure control. We considered the possibility that this medication might be causing the swelling, although it usually causes swelling in both legs. We also discussed your blood pressure, which is controlled with amlodipine.  YOUR PLAN:  -LEFT ANKLE AND CALF SWELLING: This is a condition where your left ankle and calf have become larger than normal. We will order an ultrasound of your lower leg to check for a condition called deep vein thrombosis, which is a blood clot in the deep veins of your leg. If the ultrasound doesn't show any blood clots, we may consider changing your blood pressure medication, as amlodipine can sometimes cause swelling.  -HYPERTENSION: This is a condition where the pressure in your blood vessels is too high. Your blood pressure is currently controlled with amlodipine. However, if the ultrasound for your leg swelling is negative, we may consider changing this medication.  INSTRUCTIONS:  Please go to the emergency room if you experience worsening pain, shortness of breath, or chest pain. We will contact you to schedule an ultrasound for your leg. If the ultrasound doesn't show any blood clots, we will discuss the possibility of changing your blood pressure medication.

## 2022-08-07 ENCOUNTER — Ambulatory Visit (HOSPITAL_BASED_OUTPATIENT_CLINIC_OR_DEPARTMENT_OTHER)
Admission: RE | Admit: 2022-08-07 | Discharge: 2022-08-07 | Disposition: A | Discharge status: Home / Self Care | Payer: 59 | Source: Ambulatory Visit | Attending: Family | Admitting: Family

## 2022-08-07 DIAGNOSIS — M7989 Other specified soft tissue disorders: Secondary | ICD-10-CM | POA: Diagnosis not present

## 2022-08-08 ENCOUNTER — Telehealth: Payer: Self-pay | Admitting: Family

## 2022-08-08 ENCOUNTER — Other Ambulatory Visit (HOSPITAL_COMMUNITY): Payer: Self-pay

## 2022-08-08 ENCOUNTER — Encounter (HOSPITAL_COMMUNITY): Payer: Self-pay

## 2022-08-08 DIAGNOSIS — I10 Essential (primary) hypertension: Secondary | ICD-10-CM

## 2022-08-08 MED ORDER — HYDROCHLOROTHIAZIDE 25 MG PO TABS
25.0000 mg | ORAL_TABLET | Freq: Every day | ORAL | 1 refills | Status: DC
  Filled 2022-08-08 – 2022-08-09 (×2): qty 90, 90d supply, fill #0
  Filled 2022-11-05: qty 90, 90d supply, fill #1

## 2022-08-08 NOTE — Telephone Encounter (Signed)
Advised pt that [place] negative for clot.  Swelling likely due to amlodipine.  Will D/C amlodipine and begin hydrochlorothiazide once daily.   Please contact pt to schedule a nurse visit with labs (BMET- dx HTN) in 1 week.

## 2022-08-09 ENCOUNTER — Other Ambulatory Visit (HOSPITAL_COMMUNITY): Payer: Self-pay

## 2022-08-09 ENCOUNTER — Other Ambulatory Visit: Payer: Self-pay

## 2022-08-09 NOTE — Telephone Encounter (Signed)
Lvm to sched bp check and lab.

## 2022-08-09 NOTE — Telephone Encounter (Signed)
I placed order for bmet.

## 2022-08-20 ENCOUNTER — Ambulatory Visit: Payer: 59 | Admitting: Family

## 2022-08-27 ENCOUNTER — Ambulatory Visit: Payer: 59 | Admitting: Family

## 2022-08-27 VITALS — BP 136/80 | HR 72 | Temp 98.1°F | Resp 16 | Wt 201.0 lb

## 2022-08-27 DIAGNOSIS — M7989 Other specified soft tissue disorders: Secondary | ICD-10-CM

## 2022-08-27 DIAGNOSIS — I10 Essential (primary) hypertension: Secondary | ICD-10-CM

## 2022-08-27 NOTE — Progress Notes (Unsigned)
Subjective:     Patient ID: Kathy Kathy [name], Kathy [name]    DOB: [birth year], 42 y.o.   MRN: [id number]  Chief Complaint  Patient presents with   Hypertension    Here for follow up after med change   Leg Swelling    Patient complains of still having swelling of left leg    Hypertension Pertinent negatives include no chest pain or palpitations.    Discussed the use of AI scribe software for clinical note transcription with the patient, who gave verbal consent to proceed.   42 year old Kathy [name] coming to office today for follow up for hypertension. Kathy Kathy [name] states that Kathy Kathy [name] is still having swelling in Kathy Kathy [name] left foot and ankle. Patient states that the swelling is not getting any worse since last visit and that it looks the same to Kathy Kathy [name]. Kathy Kathy [name] states that the swelling does go down at night when Kathy Kathy [name] is sleeping but it returns when Kathy Kathy [name] gets up and moving around. Some pain noted at the ankle joint per patient. Went over [place] results with Kathy Kathy [name]. Was changed from Amlodipine to Hydrochlorothiazide 25mg .  LLE doppler was negative for clot.  Patient states no symptoms or side effects noted since starting the new blood pressure medications.      Health Maintenance Due  Topic Date Due   HIV Screening  Never done   COVID-19 Vaccine (1 - 2023-24 season) Never done   INFLUENZA VACCINE  08/22/2022    Past Medical History:  Diagnosis Date   History of chicken pox    Migraine 07/2014    Past Surgical History:  Procedure Laterality Date   CHOLECYSTECTOMY  2002   TUBAL LIGATION  2002    Family History  Problem Relation Age of Onset   Breast cancer Mother    Hypertension Father    Arthritis Maternal Grandmother    Diabetes Maternal Grandmother    Congestive Heart Failure Maternal Grandmother    Diabetes Mellitus II Maternal Grandfather    Diabetes Mellitus II Paternal Grandmother    COPD Paternal Grandfather     Social History   Socioeconomic History   Marital status: Married    Spouse name: Not  on file   Number of children: Not on file   Years of education: Not on file   Highest education level: Associate degree: academic program  Occupational History   Not on file  Tobacco Use   Smoking status: Never   Smokeless tobacco: Never  Substance and Sexual Activity   Alcohol use: No   Drug use: No   Sexual activity: Not on file  Other Topics Concern   Not on file  Social History Narrative   Married   2 children    1998- son Alycia Rossetti   2002- Gerre Pebbles   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    Enjoys sleeping, going to pool   Social Determinants of Corporate investment banker Strain: Low Risk  (04/30/2022)   Overall Financial Resource Strain (CARDIA)    Difficulty of Paying Living Expenses: Not hard at all  Food Insecurity: No Food Insecurity (04/30/2022)   Hunger Vital Sign    Worried About Running Out of Food in the Last Year: Never true    Ran Out of Food in the Last Year: Never true  Transportation Needs: No Transportation Needs (04/30/2022)   PRAPARE - Administrator, Civil Service (Medical): No    Lack of Transportation (Non-Medical): No  Physical Activity: Sufficiently Active (  04/30/2022)   Exercise Vital Sign    Days of Exercise per Week: 5 days    Minutes of Exercise per Session: 60 min  Stress: No Stress Concern Present (04/30/2022)   [employer] of Occupational Health - Occupational Stress Questionnaire    Feeling of Stress : Not at all  Social Connections: Socially Integrated (04/30/2022)   Social Connection and Isolation Panel [NHANES]    Frequency of Communication with Friends and Family: More than three times a week    Frequency of Social Gatherings with Friends and Family: Patient declined    Attends Religious Services: More than 4 times per year    Active Member of [employer] or Organizations: Yes    Attends Engineer, structural: More than 4 times per year    Marital Status: Married  Catering manager Violence: Not on file    Outpatient  Medications Prior to Visit  Medication Sig Dispense Refill   hydrochlorothiazide (HYDRODIURIL) 25 MG tablet Take 1 tablet (25 mg total) by mouth daily. 90 tablet 1   ondansetron (ZOFRAN) 4 MG tablet Take 4 mg by mouth every 8 (eight) hours as needed for nausea or vomiting.     scopolamine (TRANSDERM-SCOP) 1 MG/3DAYS Place 1 patch (1.5 mg total) onto the skin every 3 (three) days. 4 patch 0   SUMAtriptan (IMITREX) Kathy MG tablet Take 1 tablet (Kathy mg total) by mouth every 2 (two) hours as needed for migraine. May repeat in 2 hours if headache persists or recurs. 10 tablet 5   No facility-administered medications prior to visit.    No Known Allergies  Review of Systems  Constitutional:  Negative for chills and fever.  HENT:  Negative for ear discharge, ear pain and tinnitus.   Eyes:  Negative for double vision.  Respiratory:  Negative for cough.   Cardiovascular:  Negative for chest pain and palpitations.  Gastrointestinal:  Negative for constipation, diarrhea, heartburn and vomiting.  Genitourinary:  Negative for urgency.  Musculoskeletal:  Positive for joint pain (noted in left ankle).       Objective:    Physical Exam Constitutional:      Appearance: Normal appearance.  HENT:     Head: Normocephalic.     Nose: Nose normal.  Cardiovascular:     Rate and Rhythm: Normal rate and regular rhythm.     Pulses: Normal pulses.     Heart sounds: Normal heart sounds.  Pulmonary:     Effort: Pulmonary effort is normal.     Breath sounds: Normal breath sounds.  Musculoskeletal:        General: Swelling (noted in left foot and ankle) present.     Cervical back: Normal range of motion.       Feet:  Feet:     Comments: Swelling and pain noted in left foot and ankle.  Skin:    General: Skin is warm.  Neurological:     General: No focal deficit present.     Kathy Status: Kathy Kathy [name]. Kathy Kathy [name].      BP 136/80 (BP Location: Right Arm, Patient Position: Supine,  Cuff Size: Large)   Pulse 72   Temp 98.1 F (36.7 C) (Oral)   Resp 16   Wt 201 lb (91.2 kg)   SpO2 100%   BMI 33.45 kg/m  Wt Readings from Last 3 Encounters:  08/27/22 201 lb (91.2 kg)  08/06/22 200 lb (90.7 kg)  04/30/22 195 lb (88.5 kg)  Assessment & Plan:   Problem List Items Addressed This Visit       Unprioritized   Leg swelling    Residual swelling is likely due to venous insufficiency. Discussed use of compression hose. Monitor.       Hypertension - Primary    BP stable. Some improvement in LE edema.  Continue hydrochlorothiazide, obtain follow up bmet.       Relevant Orders   Basic Metabolic Panel (BMET) (Completed)    I am having Kathy Kathy [name] maintain Kathy Kathy [name] ondansetron, SUMAtriptan, scopolamine, and hydrochlorothiazide.  No orders of the defined types were placed in this encounter.

## 2022-08-28 ENCOUNTER — Telehealth: Payer: Self-pay | Admitting: Family

## 2022-08-28 ENCOUNTER — Other Ambulatory Visit: Payer: Self-pay

## 2022-08-28 ENCOUNTER — Encounter (HOSPITAL_BASED_OUTPATIENT_CLINIC_OR_DEPARTMENT_OTHER): Payer: Self-pay

## 2022-08-28 ENCOUNTER — Other Ambulatory Visit (HOSPITAL_BASED_OUTPATIENT_CLINIC_OR_DEPARTMENT_OTHER): Payer: Self-pay

## 2022-08-28 DIAGNOSIS — E876 Hypokalemia: Secondary | ICD-10-CM

## 2022-08-28 MED ORDER — POTASSIUM CHLORIDE CRYS ER 20 MEQ PO TBCR
20.0000 meq | EXTENDED_RELEASE_TABLET | Freq: Every day | ORAL | 1 refills | Status: DC
Start: 2022-08-28 — End: 2022-09-30

## 2022-08-28 MED ORDER — POTASSIUM CHLORIDE CRYS ER 20 MEQ PO TBCR
20.0000 meq | EXTENDED_RELEASE_TABLET | Freq: Every day | ORAL | 1 refills | Status: DC
Start: 2022-08-28 — End: 2022-08-28
  Filled 2022-08-28: qty 90, 90d supply, fill #0

## 2022-08-28 NOTE — Telephone Encounter (Signed)
Potassium is a bit low as a result of her being on hydrochlorothiazide.  Please add kdur 20 mEQ once daily and repeat bmet in 1 week.

## 2022-08-28 NOTE — Telephone Encounter (Signed)
Patient notified of results, new medication and scheduled to come back in about 1 week for BMET

## 2022-08-28 NOTE — Assessment & Plan Note (Signed)
Residual swelling is likely due to venous insufficiency. Discussed use of compression hose. Monitor.

## 2022-08-28 NOTE — Assessment & Plan Note (Signed)
BP stable. Some improvement in LE edema.  Continue hydrochlorothiazide, obtain follow up bmet.

## 2022-08-29 ENCOUNTER — Telehealth: Payer: Self-pay | Admitting: Family

## 2022-08-29 ENCOUNTER — Encounter: Payer: Self-pay | Admitting: Family

## 2022-08-29 DIAGNOSIS — R2242 Localized swelling, mass and lump, left lower limb: Secondary | ICD-10-CM

## 2022-08-29 NOTE — Telephone Encounter (Signed)
Please advise pt that her potassium is low. Let's add Kdur once daily, repeat bmet in 1 week.    I will also place referral to a foot dr. At her request.

## 2022-08-30 NOTE — Telephone Encounter (Signed)
Called twice but no answer, lvm for patient to be aware of results and rx. Also advised to call for lab appointment

## 2022-09-02 ENCOUNTER — Other Ambulatory Visit (HOSPITAL_BASED_OUTPATIENT_CLINIC_OR_DEPARTMENT_OTHER): Payer: Self-pay

## 2022-09-02 NOTE — Telephone Encounter (Signed)
Patient has not pick up medication yet. She will pick up today and call us for one week lab appoiment.

## 2022-09-02 NOTE — Addendum Note (Signed)
Addended by: Wilford Corner on: 09/02/2022 09:22 AM   Modules accepted: Orders

## 2022-09-06 ENCOUNTER — Other Ambulatory Visit: Payer: 59

## 2022-09-06 ENCOUNTER — Ambulatory Visit (INDEPENDENT_AMBULATORY_CARE_PROVIDER_SITE_OTHER): Payer: 59 | Admitting: Podiatry

## 2022-09-06 ENCOUNTER — Ambulatory Visit: Payer: 59

## 2022-09-06 DIAGNOSIS — M21612 Bunion of left foot: Secondary | ICD-10-CM | POA: Diagnosis not present

## 2022-09-06 DIAGNOSIS — R609 Edema, unspecified: Secondary | ICD-10-CM | POA: Diagnosis not present

## 2022-09-06 DIAGNOSIS — R601 Generalized edema: Secondary | ICD-10-CM

## 2022-09-06 MED ORDER — METHYLPREDNISOLONE 4 MG PO TBPK: ORAL_TABLET | ORAL | 0 refills | Status: DC

## 2022-09-06 NOTE — Progress Notes (Signed)
Chief Complaint  Patient presents with   Joint Swelling    Right ankle and foot swelling. Started in [place] 2024. Patient believes it started after she has a charlie horse and she did not stretch the muscle. She does not wear an ankle brace. No problems with heart or lungs. No compression socks.    HPI: 42 y.o. female presents today with concern of unilateral ankle and foot edema on the left lower extremity.  States this began ever since she had a bad charley horse at [employer] a couple months ago.  She has not had any other calf cramping since then.  Her PCP did have an ultrasound done to rule out DVT, which was negative.  Her PCP also changed her blood pressure medication, which did not result in any improvement.  She does have pitting edema depending on time of day and has photos on her phone to show how much it pits at various times of the day when she pushes on her inner ankle.  The pain is described as more of an achy discomfort.   Past Medical History:  Diagnosis Date   History of chicken pox    Migraine 07/2014    Past Surgical History:  Procedure Laterality Date   CHOLECYSTECTOMY  2002   TUBAL LIGATION  2002   No Known Allergies   Physical Exam: There were no vitals filed for this visit.  General: The patient is alert and oriented x3 in no acute distress.  Dermatology: No hemosiderin deposition is noted to the legs.  The skin is mildly taut left greater than right lower leg and ankle.  Vascular: Palpable pedal pulses bilaterally. Capillary refill within normal limits.  +1 pitting edema to the left lower leg and ankle.  No erythema or calor is noted  Neurological: Light touch sensation grossly intact bilateral feet.   Musculoskeletal Exam: No pain with compression of of gastrosoleus complex bilateral.  Upon palpation of the bulk of the gastrocnemius complex, the left is slightly more firm than the right.  No pain with ankle dorsiflexion.  Ankle dorsiflexion is less than  10 degrees with the knee extended bilateral.  There is a bony medial prominence at the first metatarsal head on the left foot.  This is consistent with bunion deformity  Radiographic Exam (left foot, 3 weightbearing views; left ankle, 2 weightbearing views, 09/06/2022):  Normal osseous mineralization.  Mild increase in soft tissue volume noted around the perimeter of the ankle.  Inferior calcaneal spur present.  Increase in first intermetatarsal angle with tibial sesamoid position of 4.  There is increased met adductus angle.  Some degenerative joint changes are noted at the second and third tarsal-metatarsal joints.  No fracture is seen  Assessment/Plan of Care: 1. Swelling   2. Bunion, left   3. Generalized edema     Meds ordered this encounter  Medications   methylPREDNISolone (MEDROL DOSEPAK) 4 MG TBPK tablet    Sig: Take as directed    Dispense:  21 tablet    Refill:  0   Discussed clinical findings with patient today.  Regarding the bunion, patient is relatively asymptomatic.  At this time will not recommend to proceed with any surgical intervention.  Patient should make sure she accommodates the prominence with her shoe gear to decrease irritation to the area.  Will start the patient on Medrol Dosepak to see if this resolves any inflammatory changes in the leg and ankle.  Requested that the patient call our office  to give a progress report in approximately 10 days on how she is doing.  She continues to have the unilateral edema will consult a vein specialist for a more thorough workup of the vascular system.  Informed the patient that her arterial pulses are normal in the lower extremities, and even though her ultrasound was negative for DVT, there may be some insufficiency within the superficial or deep venous system that needs another look.  Patient expressed understanding and will call in approximately 1 and half weeks  Ceriah Kohler D. Khilee Hendricksen, DPM, FACFAS [facility]      [address] [place], [place] [address]                Office [phone]  Fax [phone]

## 2022-09-30 ENCOUNTER — Telehealth: Payer: Self-pay | Admitting: Family

## 2022-09-30 ENCOUNTER — Encounter: Payer: Self-pay | Admitting: Family

## 2022-09-30 ENCOUNTER — Other Ambulatory Visit (HOSPITAL_COMMUNITY): Payer: Self-pay

## 2022-09-30 DIAGNOSIS — E876 Hypokalemia: Secondary | ICD-10-CM

## 2022-09-30 MED ORDER — POTASSIUM CHLORIDE CRYS ER 20 MEQ PO TBCR
20.0000 meq | EXTENDED_RELEASE_TABLET | Freq: Every day | ORAL | 1 refills | Status: DC
  Filled 2022-09-30: qty 90, 90d supply, fill #0
  Filled 2022-12-26: qty 90, 90d supply, fill #1

## 2022-09-30 NOTE — Telephone Encounter (Signed)
See mychart.  

## 2022-10-01 ENCOUNTER — Other Ambulatory Visit (HOSPITAL_COMMUNITY): Payer: Self-pay

## 2022-10-07 ENCOUNTER — Encounter: Payer: Self-pay | Admitting: Family

## 2022-10-07 ENCOUNTER — Telehealth (INDEPENDENT_AMBULATORY_CARE_PROVIDER_SITE_OTHER): Payer: 59 | Admitting: Internal Medicine

## 2022-10-07 ENCOUNTER — Encounter: Payer: Self-pay | Admitting: Internal Medicine

## 2022-10-07 VITALS — Ht 65.0 in

## 2022-10-07 DIAGNOSIS — U071 COVID-19: Secondary | ICD-10-CM

## 2022-10-07 NOTE — Telephone Encounter (Signed)
Patient scheduled for vv with Dr. Drue Novel today

## 2022-10-07 NOTE — Progress Notes (Unsigned)
Subjective:    Patient ID: Kathy [name], female    DOB: 01-Jun-1980, 42 y.o.   MRN: [id number]  DOS:  10/07/2022 Type of visit - description: Virtual Visit via Video Note  I connected with the above patient  by a video enabled telemedicine application and verified that I am speaking with the correct person using two identifiers.   Location of patient: home  Location of provider: office  Persons participating in the virtual visit: patient, provider   I discussed the limitations of evaluation and management by telemedicine and the availability of in person appointments. The patient expressed understanding and agreed to proceed.  Acute: Symptoms started approximately 3 days ago: Mild sore throat, yesterday in addition to the sore throat develop mild generalized aches and pains, felt fatigued, slept all day. She tested positive for COVID today.  Denies fever. No chest pain or difficulty breathing No nausea vomiting.  No diarrhea. Appetite is slightly decreased. No cough  Review of Systems See above   Past Medical History:  Diagnosis Date   History of chicken pox    Migraine 07/2014    Past Surgical History:  Procedure Laterality Date   CHOLECYSTECTOMY  2002   TUBAL LIGATION  2002    Current Outpatient Medications  Medication Instructions   hydrochlorothiazide (HYDRODIURIL) 25 mg, Oral, Daily   methylPREDNISolone (MEDROL DOSEPAK) 4 MG TBPK tablet Take as directed   ondansetron (ZOFRAN) 4 mg, Oral, Every 8 hours PRN   potassium chloride SA (KLOR-CON M) 20 MEQ tablet 20 mEq, Oral, Daily   scopolamine (TRANSDERM-SCOP) 1.5 mg, Transdermal, every 72 hours   SUMAtriptan (IMITREX) 50 mg, Oral, Every 2 hours PRN, May repeat in 2 hours if headache persists or recurs.       Objective:   Physical Exam Ht 5\' 5"  (1.651 m)   BMI 33.45 kg/m  This is a virtual video visit, alert oriented x 3, in no distress.  Speaking in complete sentences. No vital signs available  today.    Assessment    COVID-19: The patient is 42 years old, never had a COVID-vaccine, this is her third episode of COVID, does not recall taking antivirals before. At this point, antivirals are not indicated based on current literature. Recommend: Rest, push fluids, Tylenol as needed, call if no gradually better.  Also isolation for 5 days.  Work note sent. I encouraged her to get a vaccine next month. She verbalized understanding    I discussed the assessment and treatment plan with the patient. The patient was provided an opportunity to ask questions and all were answered. The patient agreed with the plan and demonstrated an understanding of the instructions.   The patient was advised to call back or seek an in-person evaluation if the symptoms worsen or if the condition fails to improve as anticipated.

## 2022-11-05 ENCOUNTER — Other Ambulatory Visit: Payer: Self-pay

## 2022-11-07 ENCOUNTER — Other Ambulatory Visit: Payer: Self-pay | Admitting: Family

## 2022-11-07 DIAGNOSIS — Z1231 Encounter for screening mammogram for malignant neoplasm of breast: Secondary | ICD-10-CM

## 2022-12-03 ENCOUNTER — Ambulatory Visit
Admission: RE | Admit: 2022-12-03 | Discharge: 2022-12-03 | Disposition: A | Discharge status: Home / Self Care | Payer: 59 | Source: Ambulatory Visit | Attending: Family | Admitting: Family

## 2022-12-03 ENCOUNTER — Other Ambulatory Visit: Payer: Self-pay | Admitting: Medical Genetics

## 2022-12-03 DIAGNOSIS — Z1231 Encounter for screening mammogram for malignant neoplasm of breast: Secondary | ICD-10-CM

## 2022-12-03 DIAGNOSIS — Z006 Encounter for examination for normal comparison and control in clinical research program: Secondary | ICD-10-CM

## 2022-12-13 DIAGNOSIS — H5213 Myopia, bilateral: Secondary | ICD-10-CM | POA: Diagnosis not present

## 2022-12-26 ENCOUNTER — Encounter: Payer: Self-pay | Admitting: Pharmacist

## 2022-12-26 ENCOUNTER — Other Ambulatory Visit (HOSPITAL_COMMUNITY): Payer: Self-pay

## 2022-12-26 ENCOUNTER — Other Ambulatory Visit: Payer: Self-pay

## 2023-02-03 ENCOUNTER — Other Ambulatory Visit: Payer: Self-pay

## 2023-02-03 ENCOUNTER — Other Ambulatory Visit: Payer: Self-pay | Admitting: Family

## 2023-02-03 MED ORDER — HYDROCHLOROTHIAZIDE 25 MG PO TABS
25.0000 mg | ORAL_TABLET | Freq: Every day | ORAL | 0 refills | Status: DC
  Filled 2023-02-03: qty 30, 30d supply, fill #0

## 2023-02-03 NOTE — Telephone Encounter (Signed)
 Please contact pt to schedule a follow up appointment.

## 2023-02-06 ENCOUNTER — Other Ambulatory Visit (HOSPITAL_COMMUNITY): Payer: Self-pay

## 2023-02-11 ENCOUNTER — Encounter: Payer: Self-pay | Admitting: Family

## 2023-02-11 NOTE — Telephone Encounter (Signed)
Form emailed to patient at her request

## 2023-02-11 NOTE — Telephone Encounter (Signed)
Form completed.

## 2023-02-18 ENCOUNTER — Ambulatory Visit: Payer: 59 | Admitting: Family

## 2023-02-18 VITALS — BP 135/73 | HR 91 | Temp 98.6°F | Resp 16 | Ht 65.0 in | Wt 204.0 lb

## 2023-02-18 DIAGNOSIS — I10 Essential (primary) hypertension: Secondary | ICD-10-CM

## 2023-02-18 DIAGNOSIS — E785 Hyperlipidemia, unspecified: Secondary | ICD-10-CM

## 2023-02-18 DIAGNOSIS — E876 Hypokalemia: Secondary | ICD-10-CM | POA: Diagnosis not present

## 2023-02-18 DIAGNOSIS — M7989 Other specified soft tissue disorders: Secondary | ICD-10-CM | POA: Diagnosis not present

## 2023-02-18 DIAGNOSIS — G43809 Other migraine, not intractable, without status migrainosus: Secondary | ICD-10-CM | POA: Diagnosis not present

## 2023-02-18 DIAGNOSIS — Z833 Family history of diabetes mellitus: Secondary | ICD-10-CM

## 2023-02-18 MED ORDER — HYDROCHLOROTHIAZIDE 25 MG PO TABS
25.0000 mg | ORAL_TABLET | Freq: Every day | ORAL | 1 refills | Status: DC
  Filled 2023-02-18 – 2023-03-05 (×2): qty 90, 90d supply, fill #0
  Filled 2023-06-09: qty 90, 90d supply, fill #1

## 2023-02-18 MED ORDER — ONDANSETRON HCL 4 MG PO TABS
4.0000 mg | ORAL_TABLET | Freq: Three times a day (TID) | ORAL | 2 refills | Status: AC | PRN
  Filled 2023-02-18: qty 20, 7d supply, fill #0

## 2023-02-18 MED ORDER — POTASSIUM CHLORIDE CRYS ER 20 MEQ PO TBCR
20.0000 meq | EXTENDED_RELEASE_TABLET | Freq: Every day | ORAL | 1 refills | Status: DC
Start: 2023-02-18 — End: 2023-09-12
  Filled 2023-02-18 – 2023-03-25 (×2): qty 90, 90d supply, fill #0
  Filled 2023-06-25: qty 90, 90d supply, fill #1

## 2023-02-18 MED ORDER — SUMATRIPTAN SUCCINATE 50 MG PO TABS
50.0000 mg | ORAL_TABLET | ORAL | 5 refills | Status: AC | PRN
  Filled 2023-02-18: qty 9, 30d supply, fill #0

## 2023-02-18 NOTE — Assessment & Plan Note (Signed)
Stable, has imitrex on hand for prn use.

## 2023-02-18 NOTE — Assessment & Plan Note (Signed)
Improved

## 2023-02-18 NOTE — Assessment & Plan Note (Signed)
Lab Results  Component Value Date   CHOL 220 (H) 08/01/2021   HDL 49.60 08/01/2021   LDLCALC 141 (H) 08/01/2021   TRIG 145.0 08/01/2021   CHOLHDL 4 08/01/2021

## 2023-02-18 NOTE — Progress Notes (Unsigned)
Subjective:     Patient ID: Kathy [name], female    DOB: 12-13-1980, 43 y.o.   MRN: [id number]  Chief Complaint  Patient presents with   Hypertension    Here for follow up    HPI  Discussed the use of AI scribe software for clinical note transcription with the patient, who gave verbal consent to proceed.  History of Present Illness    The patient presents for a routine follow-up for hypertension and migraine management.  Blood pressure is well-controlled at 135/73 mmHg with hydrochlorothiazide and potassium supplements. She finds potassium pills difficult to swallow but prefers them over the powder form due to taste issues. She is open to considering alternative medications if the potassium pills become unbearable.  She has not experienced a migraine in an unspecified duration and keeps Imitrex on hand as a precaution. She wants to avoid situations where she might need the medication and not have it available.          Lab Results  Component Value Date   HGBA1C 5.3 02/18/2023      Health Maintenance Due  Topic Date Due   HIV Screening  Never done   COVID-19 Vaccine (1 - 2024-25 season) Never done    Past Medical History:  Diagnosis Date   History of chicken pox    Migraine 07/2014    Past Surgical History:  Procedure Laterality Date   CHOLECYSTECTOMY  2002   TUBAL LIGATION  2002    Family History  Problem Relation Age of Onset   Breast cancer Mother    Hypertension Father    Arthritis Maternal Grandmother    Diabetes Maternal Grandmother    Congestive Heart Failure Maternal Grandmother    Diabetes Mellitus II Maternal Grandfather    Diabetes Mellitus II Paternal Grandmother    COPD Paternal Grandfather     Social History   Socioeconomic History   Marital status: Married    Spouse name: Not on file   Number of children: Not on file   Years of education: Not on file   Highest education level: Associate degree: occupational, Scientist, product/process development, or  vocational program  Occupational History   Not on file  Tobacco Use   Smoking status: Never   Smokeless tobacco: Never  Substance and Sexual Activity   Alcohol use: No   Drug use: No   Sexual activity: Not on file  Other Topics Concern   Not on file  Social History Narrative   Married   2 children    1998- son Alycia Rossetti   2002- Gerre Pebbles   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    Enjoys sleeping, going to pool   Social Drivers of Corporate investment banker Strain: Low Risk  (02/17/2023)   Overall Financial Resource Strain (CARDIA)    Difficulty of Paying Living Expenses: Not hard at all  Food Insecurity: No Food Insecurity (02/17/2023)   Hunger Vital Sign    Worried About Running Out of Food in the Last Year: Never true    Ran Out of Food in the Last Year: Never true  Transportation Needs: No Transportation Needs (02/17/2023)   PRAPARE - Administrator, Civil Service (Medical): No    Lack of Transportation (Non-Medical): No  Physical Activity: Inactive (02/17/2023)   Exercise Vital Sign    Days of Exercise per Week: 0 days    Minutes of Exercise per Session: 60 min  Stress: No Stress Concern Present (02/17/2023)  [employer] of Occupational Health - Occupational Stress Questionnaire    Feeling of Stress : Not at all  Social Connections: Unknown (02/17/2023)   Social Connection and Isolation Panel [NHANES]    Frequency of Communication with Friends and Family: Patient declined    Frequency of Social Gatherings with Friends and Family: Once a week    Attends Religious Services: More than 4 times per year    Active Member of [employer] or Organizations: Yes    Attends Banker Meetings: 1 to 4 times per year    Marital Status: Married  Catering manager Violence: Not on file    Outpatient Medications Prior to Visit  Medication Sig Dispense Refill   hydrochlorothiazide (HYDRODIURIL) 25 MG tablet Take 1 tablet (25 mg total) by mouth daily. 30 tablet 0    ondansetron (ZOFRAN) 4 MG tablet Take 4 mg by mouth every 8 (eight) hours as needed for nausea or vomiting.     potassium chloride SA (KLOR-CON M) 20 MEQ tablet Take 1 tablet (20 mEq total) by mouth daily. 90 tablet 1   SUMAtriptan (IMITREX) 50 MG tablet Take 1 tablet (50 mg total) by mouth every 2 (two) hours as needed for migraine. May repeat in 2 hours if headache persists or recurs. 10 tablet 5   No facility-administered medications prior to visit.    No Known Allergies  ROS    See HPI Objective:    Physical Exam Constitutional:      General: She is not in acute distress.    Appearance: Normal appearance. She is well-developed.  HENT:     Head: Normocephalic and atraumatic.     Right Ear: External ear normal.     Left Ear: External ear normal.  Eyes:     General: No scleral icterus. Neck:     Thyroid: No thyromegaly.  Cardiovascular:     Rate and Rhythm: Normal rate and regular rhythm.     Heart sounds: Normal heart sounds. No murmur heard. Pulmonary:     Effort: Pulmonary effort is normal. No respiratory distress.     Breath sounds: Normal breath sounds. No wheezing.  Musculoskeletal:     Cervical back: Neck supple.  Skin:    General: Skin is warm and dry.  Neurological:     Mental Status: She is alert and oriented to person, place, and time.  Psychiatric:        Mood and Affect: Mood normal.        Behavior: Behavior normal.        Thought Content: Thought content normal.        Judgment: Judgment normal.      BP 135/73 (BP Location: Right Arm, Patient Position: Sitting, Cuff Size: Large)   Pulse 91   Temp 98.6 F (37 C) (Oral)   Resp 16   Ht 5\' 5"  (1.651 m)   Wt 204 lb (92.5 kg)   SpO2 100%   BMI 33.95 kg/m  Wt Readings from Last 3 Encounters:  02/18/23 204 lb (92.5 kg)  08/27/22 201 lb (91.2 kg)  08/06/22 200 lb (90.7 kg)       Assessment & Plan:   Problem List Items Addressed This Visit       Unprioritized   Migraine - Primary    Stable, has imitrex on hand for prn use.      Relevant Medications   SUMAtriptan (IMITREX) 50 MG tablet   hydrochlorothiazide (HYDRODIURIL) 25 MG tablet   Leg swelling  Improved.        Hypertension   BP Readings from Last 3 Encounters:  02/18/23 135/73  08/27/22 136/80  08/06/22 139/83   BP stable on hydrochlorothiazide. Continue along with potassium supplement.      Relevant Medications   hydrochlorothiazide (HYDRODIURIL) 25 MG tablet   Hyperlipidemia   Lab Results  Component Value Date   CHOL 220 (H) 08/01/2021   HDL 49.60 08/01/2021   LDLCALC 141 (H) 08/01/2021   TRIG 145.0 08/01/2021   CHOLHDL 4 08/01/2021         Relevant Medications   hydrochlorothiazide (HYDRODIURIL) 25 MG tablet   Other Relevant Orders   Comp Met (CMET) (Completed)   Lipid panel (Completed)   Other Visit Diagnoses       Family history of diabetes mellitus       Relevant Orders   HgB A1c (Completed)     Hypokalemia       Relevant Medications   potassium chloride SA (KLOR-CON M) 20 MEQ tablet       I have changed Tymesha M. Funaro's ondansetron. I am also having her maintain her potassium chloride SA, SUMAtriptan, and hydrochlorothiazide.  Meds ordered this encounter  Medications   potassium chloride SA (KLOR-CON M) 20 MEQ tablet    Sig: Take 1 tablet (20 mEq total) by mouth daily.    Dispense:  90 tablet    Refill:  1    Supervising Provider:   Danise Edge A [[id number]]   SUMAtriptan (IMITREX) 50 MG tablet    Sig: Take 1 tablet (50 mg total) by mouth every 2 (two) hours as needed for migraine. May repeat in 2 hours if headache persists or recurs.    Dispense:  10 tablet    Refill:  5    Supervising Provider:   Danise Edge A [[id number]]   ondansetron (ZOFRAN) 4 MG tablet    Sig: Take 1 tablet (4 mg total) by mouth every 8 (eight) hours as needed for nausea or vomiting.    Dispense:  20 tablet    Refill:  2    Supervising Provider:   Danise Edge A [[id number]]   hydrochlorothiazide  (HYDRODIURIL) 25 MG tablet    Sig: Take 1 tablet (25 mg total) by mouth daily.    Dispense:  90 tablet    Refill:  1    Pt requests that medication be mailed to her.    Supervising Provider:   Danise Edge A [phone]

## 2023-02-19 ENCOUNTER — Other Ambulatory Visit (HOSPITAL_COMMUNITY): Payer: Self-pay

## 2023-02-19 ENCOUNTER — Encounter: Payer: Self-pay | Admitting: Family

## 2023-02-19 ENCOUNTER — Other Ambulatory Visit: Payer: Self-pay

## 2023-02-19 LAB — COMPREHENSIVE METABOLIC PANEL
ALT: 42 U/L — ABNORMAL HIGH (ref 0–35)
AST: 28 U/L (ref 0–37)
Albumin: 4.2 g/dL (ref 3.5–5.2)
Alkaline Phosphatase: 30 U/L — ABNORMAL LOW (ref 39–117)
BUN: 16 mg/dL (ref 6–23)
CO2: 29 meq/L (ref 19–32)
Calcium: 9.3 mg/dL (ref 8.4–10.5)
Chloride: 101 meq/L (ref 96–112)
Creatinine, Ser: 1.05 mg/dL (ref 0.40–1.20)
GFR: 65.17 mL/min (ref >60.00)
Glucose, Bld: 85 mg/dL (ref 70–99)
Potassium: 3.5 meq/L (ref 3.5–5.1)
Sodium: 138 meq/L (ref 135–145)
Total Bilirubin: 0.4 mg/dL (ref 0.2–1.2)
Total Protein: 7.2 g/dL (ref 6.0–8.3)

## 2023-02-19 LAB — HEMOGLOBIN A1C: Hgb A1c MFr Bld: 5.3 % (ref 4.6–6.5)

## 2023-02-19 LAB — LIPID PANEL
Cholesterol: 174 mg/dL (ref 0–200)
HDL: 38.2 mg/dL — ABNORMAL LOW (ref >39.00)
LDL Cholesterol: 92 mg/dL (ref 0–99)
NonHDL: 135.57
Total CHOL/HDL Ratio: 5
Triglycerides: 219 mg/dL — ABNORMAL HIGH (ref 0.0–149.0)
VLDL: 43.8 mg/dL — ABNORMAL HIGH (ref 0.0–40.0)

## 2023-02-19 NOTE — Assessment & Plan Note (Signed)
BP Readings from Last 3 Encounters:  02/18/23 135/73  08/27/22 136/80  08/06/22 139/83   BP stable on hydrochlorothiazide. Continue along with potassium supplement.

## 2023-02-26 ENCOUNTER — Other Ambulatory Visit (HOSPITAL_COMMUNITY): Payer: Self-pay

## 2023-03-05 ENCOUNTER — Other Ambulatory Visit: Payer: Self-pay

## 2023-03-25 ENCOUNTER — Other Ambulatory Visit: Payer: Self-pay

## 2023-03-25 ENCOUNTER — Other Ambulatory Visit (HOSPITAL_COMMUNITY): Payer: Self-pay

## 2023-03-28 ENCOUNTER — Other Ambulatory Visit (HOSPITAL_COMMUNITY): Payer: Self-pay

## 2023-06-10 ENCOUNTER — Other Ambulatory Visit (HOSPITAL_COMMUNITY): Payer: Self-pay

## 2023-06-25 ENCOUNTER — Other Ambulatory Visit (HOSPITAL_COMMUNITY): Payer: Self-pay

## 2023-07-23 ENCOUNTER — Encounter: Payer: Self-pay | Admitting: Family

## 2023-07-23 ENCOUNTER — Ambulatory Visit: Admitting: Family

## 2023-07-23 VITALS — BP 126/73 | HR 83 | Temp 98.2°F | Resp 16 | Ht 65.0 in | Wt 189.6 lb

## 2023-07-23 DIAGNOSIS — N8110 Cystocele, unspecified: Secondary | ICD-10-CM | POA: Diagnosis not present

## 2023-07-23 NOTE — Progress Notes (Signed)
 Subjective:     Patient ID: Kathy [name], female    DOB: 1980/02/24, 43 y.o.   MRN: [id number]  Chief Complaint  Patient presents with   vaginal issues     Swelling only for the past couple months      HPI  Discussed the use of AI scribe software for clinical note transcription with the patient, who gave verbal consent to proceed.  History of Present Illness  Kathy [name] is a 43 year old female who presents with vaginal issues and changes in menstrual cycle.  She experiences vaginal issues, notably noticing something protruding from her vagina while bending over in the bathtub. She has difficulty inserting a tampon, which tends to move to the side and nearly comes out during changes. Her menstrual cycle has become shorter, lasting about three days, but heavier, necessitating waking at night to change tampons. Previously, her periods lasted about five days. There is no itching, pain, discharge, painful intercourse, or stress incontinence.     Health Maintenance Due  Topic Date Due   HIV Screening  Never done   Hepatitis B Vaccines (1 of 3 - 19+ 3-dose series) Never done   HPV VACCINES (1 - 3-dose SCDM series) Never done   COVID-19 Vaccine (1 - 2024-25 season) Never done    Past Medical History:  Diagnosis Date   History of chicken pox    Migraine 07/2014    Past Surgical History:  Procedure Laterality Date   CHOLECYSTECTOMY  2002   TUBAL LIGATION  2002    Family History  Problem Relation Age of Onset   Breast cancer Mother    Hypertension Father    Arthritis Maternal Grandmother    Diabetes Maternal Grandmother    Congestive Heart Failure Maternal Grandmother    Diabetes Mellitus II Maternal Grandfather    Diabetes Mellitus II Paternal Grandmother    COPD Paternal Grandfather     Social History   Socioeconomic History   Marital status: Married    Spouse name: Not on file   Number of children: Not on file   Years of education: Not on file    Highest education level: Associate degree: academic program  Occupational History   Not on file  Tobacco Use   Smoking status: Never   Smokeless tobacco: Never  Substance and Sexual Activity   Alcohol use: No   Drug use: No   Sexual activity: Not on file  Other Topics Concern   Not on file  Social History Narrative   Married   2 children    1998- son Bernardino   2002- [place]   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    Enjoys sleeping, going to pool   Social Drivers of Corporate investment banker Strain: Low Risk  (07/22/2023)   Overall Financial Resource Strain (CARDIA)    Difficulty of Paying Living Expenses: Not very hard  Food Insecurity: No Food Insecurity (07/22/2023)   Hunger Vital Sign    Worried About Running Out of Food in the Last Year: Never true    Ran Out of Food in the Last Year: Never true  Transportation Needs: No Transportation Needs (07/22/2023)   PRAPARE - Administrator, Civil Service (Medical): No    Lack of Transportation (Non-Medical): No  Physical Activity: Inactive (07/22/2023)   Exercise Vital Sign    Days of Exercise per Week: 0 days    Minutes of Exercise per Session: Not on  file  Stress: Stress Concern Present (07/22/2023)   [employer] of Occupational Health - Occupational Stress Questionnaire    Feeling of Stress: To some extent  Social Connections: Unknown (07/22/2023)   Social Connection and Isolation Panel    Frequency of Communication with Friends and Family: Once a week    Frequency of Social Gatherings with Friends and Family: Patient declined    Attends Religious Services: More than 4 times per year    Active Member of [employer] or Organizations: Yes    Attends Engineer, structural: More than 4 times per year    Marital Status: Married  Catering manager Violence: Not on file    Outpatient Medications Prior to Visit  Medication Sig Dispense Refill   hydrochlorothiazide  (HYDRODIURIL ) 25 MG tablet Take 1 tablet (25 mg  total) by mouth daily. 90 tablet 1   ondansetron  (ZOFRAN ) 4 MG tablet Take 1 tablet (4 mg total) by mouth every 8 (eight) hours as needed for nausea or vomiting. 20 tablet 2   potassium chloride  SA (KLOR-CON  M) 20 MEQ tablet Take 1 tablet (20 mEq total) by mouth daily. 90 tablet 1   SUMAtriptan  (IMITREX ) 50 MG tablet Take 1 tablet (50 mg total) by mouth every 2 (two) hours as needed for migraine. May repeat in 2 hours if headache persists or recurs. 10 tablet 5   No facility-administered medications prior to visit.    No Known Allergies  ROS    See HPI Objective:    Physical Exam Exam conducted with a chaperone present.  Constitutional:      Appearance: Normal appearance.  Cardiovascular:     Rate and Rhythm: Normal rate.  Pulmonary:     Effort: Pulmonary effort is normal.  Genitourinary:    General: Normal vulva.     Cervix: Normal.     Uterus: Normal.      Adnexa: Right adnexa normal and left adnexa normal.     Comments: Grade 1 cystocele noted Skin:    General: Skin is warm and dry.  Neurological:     Mental Status: She is alert.      BP 126/73 (BP Location: Right Arm, Patient Position: Sitting, Cuff Size: Normal)   Pulse 83   Temp 98.2 F (36.8 C) (Oral)   Resp 16   Ht 5' 5 (1.651 m)   Wt 189 lb 9.6 oz (86 kg)   SpO2 100%   BMI 31.55 kg/m  Wt Readings from Last 3 Encounters:  07/23/23 189 lb 9.6 oz (86 kg)  02/18/23 204 lb (92.5 kg)  08/27/22 201 lb (91.2 kg)       Assessment & Plan:   Problem List Items Addressed This Visit       Unprioritized   Baden-Walker grade 1 cystocele - Primary    Mild cystocele confirmed, likely related to weakened pelvic muscles from childbirth. No pain or incontinence. Surgery not indicated. Reassurance provided.  - Refer to pelvic floor physical therapy for muscle strengthening. - Consider urogynecology referral if symptoms persist or worsen.       Relevant Orders   Ambulatory referral to Physical Therapy    I  am having [name] maintain her potassium chloride  SA, SUMAtriptan , ondansetron , and hydrochlorothiazide .  No orders of the defined types were placed in this encounter.

## 2023-07-23 NOTE — Assessment & Plan Note (Addendum)
  Mild cystocele confirmed, likely related to weakened pelvic muscles from childbirth. No pain or incontinence. Surgery not indicated. Reassurance provided.  - Refer to pelvic floor physical therapy for muscle strengthening. - Consider urogynecology referral if symptoms persist or worsen.

## 2023-07-23 NOTE — Patient Instructions (Signed)
 VISIT SUMMARY:  Today, you were seen for vaginal issues and changes in your menstrual cycle. You noticed something protruding while bending over and have had difficulty with tampons. Your menstrual cycle has become shorter but heavier.  YOUR PLAN:  CYSTOCELE: You have a mild cystocele, which is a condition where the bladder drops and pushes against the vaginal wall. This is related to weakened pelvic muscles from childbirth. -You will be referred to pelvic floor physical therapy to help strengthen your pelvic muscles. -If your symptoms persist or worsen, we may consider referring you to a urogynecologist.  MENSTRUAL CHANGES: Your menstrual cycle has become shorter and heavier, which suggests hormonal changes likely related to perimenopause. -No intervention is needed at this time unless your symptoms worsen.

## 2023-08-08 ENCOUNTER — Other Ambulatory Visit (HOSPITAL_COMMUNITY)

## 2023-09-12 ENCOUNTER — Encounter: Payer: Self-pay | Admitting: Family

## 2023-09-12 ENCOUNTER — Ambulatory Visit: Admitting: Family

## 2023-09-12 ENCOUNTER — Other Ambulatory Visit (HOSPITAL_COMMUNITY): Payer: Self-pay

## 2023-09-12 VITALS — BP 132/80 | HR 71 | Temp 98.6°F | Resp 16 | Ht 65.0 in | Wt 193.0 lb

## 2023-09-12 DIAGNOSIS — E785 Hyperlipidemia, unspecified: Secondary | ICD-10-CM

## 2023-09-12 DIAGNOSIS — R400 Somnolence: Secondary | ICD-10-CM | POA: Diagnosis not present

## 2023-09-12 DIAGNOSIS — R7989 Other specified abnormal findings of blood chemistry: Secondary | ICD-10-CM | POA: Diagnosis not present

## 2023-09-12 DIAGNOSIS — E876 Hypokalemia: Secondary | ICD-10-CM

## 2023-09-12 DIAGNOSIS — I10 Essential (primary) hypertension: Secondary | ICD-10-CM | POA: Diagnosis not present

## 2023-09-12 DIAGNOSIS — Z0000 Encounter for general adult medical examination without abnormal findings: Secondary | ICD-10-CM

## 2023-09-12 DIAGNOSIS — G43809 Other migraine, not intractable, without status migrainosus: Secondary | ICD-10-CM | POA: Diagnosis not present

## 2023-09-12 DIAGNOSIS — Z0001 Encounter for general adult medical examination with abnormal findings: Secondary | ICD-10-CM | POA: Diagnosis not present

## 2023-09-12 LAB — COMPREHENSIVE METABOLIC PANEL WITH GFR
ALT: 31 U/L (ref 0–35)
AST: 25 U/L (ref 0–37)
Albumin: 4.3 g/dL (ref 3.5–5.2)
Alkaline Phosphatase: 33 U/L — ABNORMAL LOW (ref 39–117)
BUN: 11 mg/dL (ref 6–23)
CO2: 27 meq/L (ref 19–32)
Calcium: 9.4 mg/dL (ref 8.4–10.5)
Chloride: 100 meq/L (ref 96–112)
Creatinine, Ser: 0.9 mg/dL (ref 0.40–1.20)
GFR: 78.1 mL/min (ref >60.00)
Glucose, Bld: 85 mg/dL (ref 70–99)
Potassium: 4 meq/L (ref 3.5–5.1)
Sodium: 136 meq/L (ref 135–145)
Total Bilirubin: 0.7 mg/dL (ref 0.2–1.2)
Total Protein: 7.4 g/dL (ref 6.0–8.3)

## 2023-09-12 LAB — LIPID PANEL
Cholesterol: 226 mg/dL — ABNORMAL HIGH (ref 0–200)
HDL: 41.3 mg/dL (ref >39.00)
LDL Cholesterol: 156 mg/dL — ABNORMAL HIGH (ref 0–99)
NonHDL: 184.33
Total CHOL/HDL Ratio: 5
Triglycerides: 141 mg/dL (ref 0.0–149.0)
VLDL: 28.2 mg/dL (ref 0.0–40.0)

## 2023-09-12 MED ORDER — HYDROCHLOROTHIAZIDE 25 MG PO TABS
25.0000 mg | ORAL_TABLET | Freq: Every day | ORAL | 1 refills | Status: AC
  Filled 2023-09-12: qty 90, 90d supply, fill #0
  Filled 2023-12-09: qty 90, 90d supply, fill #1

## 2023-09-12 MED ORDER — POTASSIUM CHLORIDE CRYS ER 20 MEQ PO TBCR
20.0000 meq | EXTENDED_RELEASE_TABLET | Freq: Every day | ORAL | 1 refills | Status: AC
  Filled 2023-09-12: qty 90, 90d supply, fill #0
  Filled 2023-12-24 – 2024-01-05 (×2): qty 90, 90d supply, fill #1

## 2023-09-12 NOTE — Progress Notes (Signed)
 Subjective:     Patient ID: Kathy [name], female    DOB: 1980/10/07, 43 y.o.   MRN: [id number]  Chief Complaint  Patient presents with   Annual Exam    HPI  Discussed the use of AI scribe software for clinical note transcription with the patient, who gave verbal consent to proceed.  History of Present Illness   Kathy [name] is a 43 year old female who presents for an annual physical exam.  She is experiencing weight gain since her last visit, previously weighing 204 pounds and reducing to 189 pounds with semaglutide, which she is no longer taking. She is currently moving and working two jobs, affecting her diet and exercise routine. She requires a refill for her potassium supplement and is uncertain about her blood pressure medication status. She experiences stress due to moving but denies symptoms of depression or anxiety beyond situational stress. She has no current migraines, cough, cold symptoms, skin issues, hearing or vision concerns, leg swelling, digestive issues, unusual muscle or joint pain, or urinary issues. She has not had any surgeries this year and reports no changes in her family medical history. She feels tired during the day but has not undergone a sleep study. No alcohol, drug, or tobacco use.     Health Maintenance Due  Topic Date Due   HIV Screening  Never done   HPV VACCINES (1 - 3-dose SCDM series) Never done   COVID-19 Vaccine (1 - 2024-25 season) Never done   INFLUENZA VACCINE  08/22/2023    Past Medical History:  Diagnosis Date   History of chicken pox    Migraine 07/2014    Past Surgical History:  Procedure Laterality Date   CHOLECYSTECTOMY  2002   TUBAL LIGATION  2002    Family History  Problem Relation Age of Onset   Breast cancer Mother    Hypertension Father    Arthritis Maternal Grandmother    Diabetes Maternal Grandmother    Congestive Heart Failure Maternal Grandmother    Diabetes Mellitus II Maternal Grandfather     Diabetes Mellitus II Paternal Grandmother    COPD Paternal Grandfather     Social History   Socioeconomic History   Marital status: Married    Spouse name: Not on file   Number of children: Not on file   Years of education: Not on file   Highest education level: Associate degree: academic program  Occupational History   Not on file  Tobacco Use   Smoking status: Never   Smokeless tobacco: Never  Substance and Sexual Activity   Alcohol use: No   Drug use: No   Sexual activity: Not on file  Other Topics Concern   Not on file  Social History Narrative   Married   2 children    1998- son Bernardino   2002- Honora   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    Enjoys sleeping, going to pool   Social Drivers of Corporate investment banker Strain: Low Risk  (07/22/2023)   Overall Financial Resource Strain (CARDIA)    Difficulty of Paying Living Expenses: Not very hard  Food Insecurity: No Food Insecurity (07/22/2023)   Hunger Vital Sign    Worried About Running Out of Food in the Last Year: Never true    Ran Out of Food in the Last Year: Never true  Transportation Needs: No Transportation Needs (07/22/2023)   PRAPARE - Administrator, Civil Service (Medical):  No    Lack of Transportation (Non-Medical): No  Physical Activity: Inactive (07/22/2023)   Exercise Vital Sign    Days of Exercise per Week: 0 days    Minutes of Exercise per Session: Not on file  Stress: Stress Concern Present (07/22/2023)   [employer] of Occupational Health - Occupational Stress Questionnaire    Feeling of Stress: To some extent  Social Connections: Unknown (07/22/2023)   Social Connection and Isolation Panel    Frequency of Communication with Friends and Family: Once a week    Frequency of Social Gatherings with Friends and Family: Patient declined    Attends Religious Services: More than 4 times per year    Active Member of [employer] or Organizations: Yes    Attends Hospital doctor: More than 4 times per year    Marital Status: Married  Catering manager Violence: Not on file    Outpatient Medications Prior to Visit  Medication Sig Dispense Refill   ondansetron  (ZOFRAN ) 4 MG tablet Take 1 tablet (4 mg total) by mouth every 8 (eight) hours as needed for nausea or vomiting. 20 tablet 2   SUMAtriptan  (IMITREX ) 50 MG tablet Take 1 tablet (50 mg total) by mouth every 2 (two) hours as needed for migraine. May repeat in 2 hours if headache persists or recurs. 10 tablet 5   hydrochlorothiazide  (HYDRODIURIL ) 25 MG tablet Take 1 tablet (25 mg total) by mouth daily. 90 tablet 1   potassium chloride  SA (KLOR-CON  M) 20 MEQ tablet Take 1 tablet (20 mEq total) by mouth daily. 90 tablet 1   No facility-administered medications prior to visit.    No Known Allergies  Review of Systems  Constitutional:  Negative for weight loss.  HENT:  Negative for congestion and hearing loss.   Eyes:  Negative for blurred vision.  Respiratory:  Negative for cough.   Cardiovascular:  Negative for leg swelling.  Gastrointestinal:  Negative for constipation and diarrhea.  Genitourinary:  Negative for dysuria and frequency.  Musculoskeletal:  Negative for joint pain and myalgias.  Skin:  Negative for rash.  Neurological:  Negative for headaches.  Psychiatric/Behavioral:         Denies depression/anxiety       Objective:    Physical Exam Constitutional:      General: She is not in acute distress.    Appearance: Normal appearance. She is well-developed.  HENT:     Head: Normocephalic and atraumatic.     Right Ear: Tympanic membrane, ear canal and external ear normal.     Left Ear: Tympanic membrane, ear canal and external ear normal.     Mouth/Throat:     Mouth: Mucous membranes are dry.  Eyes:     General: No scleral icterus. Neck:     Thyroid : No thyromegaly.  Cardiovascular:     Rate and Rhythm: Normal rate and regular rhythm.     Heart sounds: Normal heart sounds. No  murmur heard. Pulmonary:     Effort: Pulmonary effort is normal. No respiratory distress.     Breath sounds: Normal breath sounds. No wheezing.  Abdominal:     General: There is no distension.     Palpations: Abdomen is soft.     Tenderness: There is no abdominal tenderness.  Musculoskeletal:        General: No swelling.     Cervical back: Neck supple.  Lymphadenopathy:     Cervical: No cervical adenopathy.  Skin:    General: Skin is warm  and dry.  Neurological:     Mental Status: She is alert and oriented to person, place, and time.  Psychiatric:        Mood and Affect: Mood normal.        Behavior: Behavior normal.        Thought Content: Thought content normal.        Judgment: Judgment normal.      BP 132/80 (BP Location: Right Arm, Patient Position: Sitting, Cuff Size: Large)   Pulse 71   Temp 98.6 F (37 C) (Oral)   Resp 16   Ht 5' 5 (1.651 m)   Wt 193 lb (87.5 kg)   SpO2 100%   BMI 32.12 kg/m  Wt Readings from Last 3 Encounters:  09/12/23 193 lb (87.5 kg)  07/23/23 189 lb 9.6 oz (86 kg)  02/18/23 204 lb (92.5 kg)       Assessment & Plan:   Problem List Items Addressed This Visit       Unprioritized   Preventative health care - Primary   Discussed healthy diet, exercise and weight loss.  Pap and mammo up to date. Plan to begin colon cancer screening at 44.  Declines gardisil and Hep B. Other immunizations up to date.       Migraine   No recent migraines- has imitrex  on hand for prn use.      Relevant Medications   hydrochlorothiazide  (HYDRODIURIL ) 25 MG tablet   Hypertension   BP Readings from Last 3 Encounters:  09/12/23 132/80  07/23/23 126/73  02/18/23 135/73   BP stable on hydrochlorothiazide . Continue same.       Relevant Medications   hydrochlorothiazide  (HYDRODIURIL ) 25 MG tablet   Other Relevant Orders   Comp Met (CMET)   Hyperlipidemia   Lab Results  Component Value Date   CHOL 174 02/18/2023   HDL 38.20 (L) 02/18/2023    LDLCALC 92 02/18/2023   TRIG 219.0 (H) 02/18/2023   CHOLHDL 5 02/18/2023         Relevant Medications   hydrochlorothiazide  (HYDRODIURIL ) 25 MG tablet   Other Relevant Orders   Lipid panel   Daytime somnolence   + snoring. Will refer for sleep study.       Relevant Orders   Ambulatory referral to Pulmonology   Abnormal LFTs   Update LFT today.       Other Visit Diagnoses       Hypokalemia       Relevant Medications   potassium chloride  SA (KLOR-CON  M) 20 MEQ tablet       I am having Deven M. Gorelick maintain her SUMAtriptan , ondansetron , potassium chloride  SA, and hydrochlorothiazide .  Meds ordered this encounter  Medications   potassium chloride  SA (KLOR-CON  M) 20 MEQ tablet    Sig: Take 1 tablet (20 mEq total) by mouth daily.    Dispense:  90 tablet    Refill:  1    Supervising Provider:   DOMENICA BLACKBIRD A [[id number]]   hydrochlorothiazide  (HYDRODIURIL ) 25 MG tablet    Sig: Take 1 tablet (25 mg total) by mouth daily.    Dispense:  90 tablet    Refill:  1    Pt requests that medication be mailed to her.    Supervising Provider:   DOMENICA BLACKBIRD A [id number]

## 2023-09-12 NOTE — Assessment & Plan Note (Signed)
 Update LFT today.

## 2023-09-12 NOTE — Assessment & Plan Note (Signed)
 Discussed healthy diet, exercise and weight loss.  Pap and mammo up to date. Plan to begin colon cancer screening at 44.  Declines gardisil and Hep B. Other immunizations up to date.

## 2023-09-12 NOTE — Assessment & Plan Note (Signed)
+   snoring. Will refer for sleep study.

## 2023-09-12 NOTE — Assessment & Plan Note (Signed)
 No recent migraines- has imitrex  on hand for prn use.

## 2023-09-12 NOTE — Patient Instructions (Signed)
 VISIT SUMMARY:  Today, you had your annual physical exam. We discussed your weight gain, stress, and various health concerns including hypertension, hypertriglyceridemia, fatty liver, obesity, and suspected sleep apnea. We also reviewed your current medications and made necessary adjustments.  YOUR PLAN:  ADULT WELLNESS VISIT: We discussed your immunizations, including hepatitis B and Gardasil vaccines, which you declined due to perceived low risk. -Continue routine health maintenance.  HYPERTENSION: You need a refill for your blood pressure medication and monitoring of your potassium levels. -Refill hydrochlorothiazide . -Check potassium and kidney function.  HYPERTRIGLYCERIDEMIA: Your triglyceride levels were previously elevated. We discussed dietary changes to help manage these levels. -Check cholesterol and triglyceride levels. -Reduce intake of white carbohydrates and sugars.  FATTY LIVER (HEPATIC STEATOSIS): Your liver tests are slightly elevated, likely due to fatty liver. We discussed continuing lifestyle modifications. -Continue lifestyle modifications to manage fatty liver.  OBESITY: You have gained weight since stopping semaglutide, and stress is affecting your diet and exercise. -Encourage healthy diet and exercise when possible.  SUSPECTED OBSTRUCTIVE SLEEP APNEA: You are experiencing daytime fatigue, which may suggest sleep apnea. We discussed a referral for a sleep study. -Refer to pulmonary for sleep study.  MIGRAINE: You have not had any recent migraines, and your Imitrex  prescription is current with refills available. -Continue Imitrex  as needed for migraines.

## 2023-09-12 NOTE — Assessment & Plan Note (Addendum)
 BP Readings from Last 3 Encounters:  09/12/23 132/80  07/23/23 126/73  02/18/23 135/73   BP stable on hydrochlorothiazide . Continue same.

## 2023-09-12 NOTE — Assessment & Plan Note (Signed)
 Lab Results  Component Value Date   CHOL 174 02/18/2023   HDL 38.20 (L) 02/18/2023   LDLCALC 92 02/18/2023   TRIG 219.0 (H) 02/18/2023   CHOLHDL 5 02/18/2023

## 2023-09-13 ENCOUNTER — Ambulatory Visit: Payer: Self-pay | Admitting: Family

## 2023-10-28 ENCOUNTER — Ambulatory Visit (HOSPITAL_BASED_OUTPATIENT_CLINIC_OR_DEPARTMENT_OTHER)

## 2023-11-13 ENCOUNTER — Other Ambulatory Visit: Payer: Self-pay | Admitting: Medical Genetics

## 2023-11-13 DIAGNOSIS — Z006 Encounter for examination for normal comparison and control in clinical research program: Secondary | ICD-10-CM

## 2023-11-19 ENCOUNTER — Ambulatory Visit (HOSPITAL_BASED_OUTPATIENT_CLINIC_OR_DEPARTMENT_OTHER)

## 2023-12-01 ENCOUNTER — Other Ambulatory Visit: Payer: Self-pay | Admitting: Family

## 2023-12-01 DIAGNOSIS — Z1231 Encounter for screening mammogram for malignant neoplasm of breast: Secondary | ICD-10-CM

## 2023-12-09 ENCOUNTER — Other Ambulatory Visit (HOSPITAL_COMMUNITY): Payer: Self-pay

## 2023-12-15 LAB — GENECONNECT MOLECULAR SCREEN: Genetic Analysis Overall Interpretation: NEGATIVE

## 2023-12-24 ENCOUNTER — Other Ambulatory Visit: Payer: Self-pay

## 2023-12-25 ENCOUNTER — Other Ambulatory Visit: Payer: Self-pay

## 2023-12-31 ENCOUNTER — Ambulatory Visit
Admission: RE | Admit: 2023-12-31 | Discharge: 2023-12-31 | Disposition: A | Discharge status: Home / Self Care | Source: Ambulatory Visit

## 2023-12-31 DIAGNOSIS — Z1231 Encounter for screening mammogram for malignant neoplasm of breast: Secondary | ICD-10-CM

## 2024-01-02 ENCOUNTER — Encounter (HOSPITAL_COMMUNITY): Payer: Self-pay

## 2024-01-02 ENCOUNTER — Other Ambulatory Visit (HOSPITAL_COMMUNITY): Payer: Self-pay

## 2024-01-05 ENCOUNTER — Other Ambulatory Visit (HOSPITAL_COMMUNITY): Payer: Self-pay

## 2024-01-05 ENCOUNTER — Other Ambulatory Visit: Payer: Self-pay

## 2024-01-06 ENCOUNTER — Other Ambulatory Visit (HOSPITAL_COMMUNITY): Payer: Self-pay

## 2024-01-07 ENCOUNTER — Ambulatory Visit: Payer: Self-pay | Admitting: Family
